# Patient Record
Sex: Male | Born: 1995 | Race: Asian | Hispanic: No | Marital: Single | Smoking: Never smoker
Health system: Southern US, Community
[De-identification: ages and names within clinical notes are randomized; demographics above are authoritative.]

## PROBLEM LIST (undated history)

## (undated) DIAGNOSIS — J301 Allergic rhinitis due to pollen: Secondary | ICD-10-CM

## (undated) DIAGNOSIS — F419 Anxiety disorder, unspecified: Secondary | ICD-10-CM

## (undated) HISTORY — DX: Anxiety disorder, unspecified: F41.9

## (undated) HISTORY — DX: Allergic rhinitis due to pollen: J30.1

---
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
## 2014-03-17 ENCOUNTER — Ambulatory Visit (INDEPENDENT_AMBULATORY_CARE_PROVIDER_SITE_OTHER): Payer: BC Managed Care – PPO | Admitting: Psychology

## 2014-03-17 DIAGNOSIS — F40298 Other specified phobia: Secondary | ICD-10-CM

## 2014-04-07 ENCOUNTER — Ambulatory Visit (INDEPENDENT_AMBULATORY_CARE_PROVIDER_SITE_OTHER): Payer: BC Managed Care – PPO | Admitting: Psychology

## 2014-04-07 DIAGNOSIS — F40298 Other specified phobia: Secondary | ICD-10-CM

## 2014-05-05 ENCOUNTER — Ambulatory Visit: Payer: BC Managed Care – PPO | Admitting: Psychology

## 2014-05-17 ENCOUNTER — Ambulatory Visit (INDEPENDENT_AMBULATORY_CARE_PROVIDER_SITE_OTHER): Payer: BC Managed Care – PPO | Admitting: Psychology

## 2014-05-17 DIAGNOSIS — F40298 Other specified phobia: Secondary | ICD-10-CM

## 2014-05-31 ENCOUNTER — Ambulatory Visit (INDEPENDENT_AMBULATORY_CARE_PROVIDER_SITE_OTHER): Payer: BC Managed Care – PPO | Admitting: Psychology

## 2014-05-31 DIAGNOSIS — F40298 Other specified phobia: Secondary | ICD-10-CM

## 2016-03-24 ENCOUNTER — Ambulatory Visit (INDEPENDENT_AMBULATORY_CARE_PROVIDER_SITE_OTHER): Payer: 59 | Admitting: Psychology

## 2016-03-24 DIAGNOSIS — F408 Other phobic anxiety disorders: Secondary | ICD-10-CM | POA: Diagnosis not present

## 2016-03-26 ENCOUNTER — Ambulatory Visit: Payer: Self-pay | Admitting: Internal Medicine

## 2016-03-31 ENCOUNTER — Encounter: Payer: Self-pay | Admitting: Emergency Medicine

## 2016-03-31 ENCOUNTER — Emergency Department: Payer: 59 | Admitting: Anesthesiology

## 2016-03-31 ENCOUNTER — Encounter
Admission: EM | Disposition: A | Discharge status: Home / Self Care | Payer: Self-pay | Source: Home / Self Care | Attending: Student

## 2016-03-31 ENCOUNTER — Observation Stay
Admission: EM | Admit: 2016-03-31 | Discharge: 2016-04-01 | Disposition: A | Discharge status: Home / Self Care | Payer: 59 | Attending: Surgery | Admitting: Surgery

## 2016-03-31 ENCOUNTER — Emergency Department: Payer: 59

## 2016-03-31 DIAGNOSIS — J301 Allergic rhinitis due to pollen: Secondary | ICD-10-CM | POA: Diagnosis not present

## 2016-03-31 DIAGNOSIS — D3610 Benign neoplasm of peripheral nerves and autonomic nervous system, unspecified: Secondary | ICD-10-CM | POA: Diagnosis not present

## 2016-03-31 DIAGNOSIS — R109 Unspecified abdominal pain: Secondary | ICD-10-CM

## 2016-03-31 DIAGNOSIS — K3580 Unspecified acute appendicitis: Secondary | ICD-10-CM | POA: Diagnosis present

## 2016-03-31 DIAGNOSIS — Z820 Family history of epilepsy and other diseases of the nervous system: Secondary | ICD-10-CM | POA: Diagnosis not present

## 2016-03-31 DIAGNOSIS — K353 Acute appendicitis with localized peritonitis, without perforation or gangrene: Secondary | ICD-10-CM | POA: Diagnosis present

## 2016-03-31 DIAGNOSIS — K381 Appendicular concretions: Secondary | ICD-10-CM | POA: Insufficient documentation

## 2016-03-31 DIAGNOSIS — Z91010 Allergy to peanuts: Secondary | ICD-10-CM | POA: Insufficient documentation

## 2016-03-31 HISTORY — PX: APPENDECTOMY: SHX54

## 2016-03-31 HISTORY — PX: LAPAROSCOPIC APPENDECTOMY: SHX408

## 2016-03-31 LAB — CBC
HEMATOCRIT: 39.7 % — AB (ref 40.0–52.0)
HEMOGLOBIN: 13.3 g/dL (ref 13.0–18.0)
MCH: 28.6 pg (ref 26.0–34.0)
MCHC: 33.6 g/dL (ref 32.0–36.0)
MCV: 85 fL (ref 80.0–100.0)
Platelets: 285 10*3/uL (ref 150–440)
RBC: 4.67 MIL/uL (ref 4.40–5.90)
RDW: 13.4 % (ref 11.5–14.5)
WBC: 13.1 10*3/uL — ABNORMAL HIGH (ref 3.8–10.6)

## 2016-03-31 LAB — COMPREHENSIVE METABOLIC PANEL
ALBUMIN: 4.6 g/dL (ref 3.5–5.0)
ALT: 18 U/L (ref 17–63)
ANION GAP: 8 (ref 5–15)
AST: 24 U/L (ref 15–41)
Alkaline Phosphatase: 74 U/L (ref 38–126)
BUN: 17 mg/dL (ref 6–20)
CHLORIDE: 100 mmol/L — AB (ref 101–111)
CO2: 27 mmol/L (ref 22–32)
Calcium: 9 mg/dL (ref 8.9–10.3)
Creatinine, Ser: 1.1 mg/dL (ref 0.61–1.24)
GFR calc Af Amer: >60 mL/min (ref >60)
GFR calc non Af Amer: >60 mL/min (ref >60)
GLUCOSE: 127 mg/dL — AB (ref 65–99)
POTASSIUM: 3.1 mmol/L — AB (ref 3.5–5.1)
SODIUM: 135 mmol/L (ref 135–145)
Total Bilirubin: 0.6 mg/dL (ref 0.3–1.2)
Total Protein: 7.8 g/dL (ref 6.5–8.1)

## 2016-03-31 LAB — URINALYSIS COMPLETE WITH MICROSCOPIC (ARMC ONLY)
BACTERIA UA: NONE SEEN
Bilirubin Urine: NEGATIVE
GLUCOSE, UA: NEGATIVE mg/dL
Hgb urine dipstick: NEGATIVE
Leukocytes, UA: NEGATIVE
NITRITE: NEGATIVE
Protein, ur: 100 mg/dL — AB
SPECIFIC GRAVITY, URINE: 1.026 (ref 1.005–1.030)
pH: 9 — ABNORMAL HIGH (ref 5.0–8.0)

## 2016-03-31 LAB — LIPASE, BLOOD: LIPASE: 21 U/L (ref 11–51)

## 2016-03-31 SURGERY — APPENDECTOMY, LAPAROSCOPIC
Anesthesia: Choice | Wound class: Clean Contaminated

## 2016-03-31 SURGERY — APPENDECTOMY, LAPAROSCOPIC
Anesthesia: General

## 2016-03-31 MED ORDER — SODIUM CHLORIDE 0.9 % IV BOLUS (SEPSIS)
1000.0000 mL | Freq: Once | INTRAVENOUS | Status: AC
  Administered 2016-03-31: 1000 mL via INTRAVENOUS

## 2016-03-31 MED ORDER — ROCURONIUM BROMIDE 100 MG/10ML IV SOLN
INTRAVENOUS | Status: DC | PRN
  Administered 2016-03-31: 40 mg via INTRAVENOUS

## 2016-03-31 MED ORDER — ONDANSETRON HCL 4 MG/2ML IJ SOLN: 4.0000 mg | Freq: Four times a day (QID) | INTRAMUSCULAR | Status: DC | PRN

## 2016-03-31 MED ORDER — PIPERACILLIN-TAZOBACTAM 3.375 G IVPB
3.3750 g | Freq: Once | INTRAVENOUS | Status: AC
  Administered 2016-03-31: 3.375 g via INTRAVENOUS
  Filled 2016-03-31: qty 50

## 2016-03-31 MED ORDER — LIDOCAINE HCL (CARDIAC) 20 MG/ML IV SOLN
INTRAVENOUS | Status: DC | PRN
  Administered 2016-03-31: 80 mg via INTRAVENOUS

## 2016-03-31 MED ORDER — DIPHENHYDRAMINE HCL 12.5 MG/5ML PO ELIX: 12.5000 mg | ORAL_SOLUTION | Freq: Four times a day (QID) | ORAL | Status: DC | PRN

## 2016-03-31 MED ORDER — PROPOFOL 10 MG/ML IV BOLUS
INTRAVENOUS | Status: DC | PRN
  Administered 2016-03-31: 150 mg via INTRAVENOUS

## 2016-03-31 MED ORDER — ONDANSETRON 8 MG PO TBDP: 4.0000 mg | ORAL_TABLET | Freq: Four times a day (QID) | ORAL | Status: DC | PRN

## 2016-03-31 MED ORDER — DIPHENHYDRAMINE HCL 50 MG/ML IJ SOLN: 12.5000 mg | Freq: Four times a day (QID) | INTRAMUSCULAR | Status: DC | PRN

## 2016-03-31 MED ORDER — PANTOPRAZOLE SODIUM 40 MG IV SOLR
40.0000 mg | Freq: Every day | INTRAVENOUS | Status: DC
  Administered 2016-03-31: 40 mg via INTRAVENOUS
  Filled 2016-03-31: qty 40

## 2016-03-31 MED ORDER — DEXTROSE 5 % IV SOLN
2.0000 g | INTRAVENOUS | Status: AC
  Administered 2016-03-31: 2 g via INTRAVENOUS
  Filled 2016-03-31: qty 2

## 2016-03-31 MED ORDER — DIATRIZOATE MEGLUMINE & SODIUM 66-10 % PO SOLN
15.0000 mL | Freq: Once | ORAL | Status: AC
  Administered 2016-03-31: 15 mL via ORAL

## 2016-03-31 MED ORDER — MORPHINE SULFATE (PF) 2 MG/ML IV SOLN: 2.0000 mg | INTRAVENOUS | Status: DC | PRN

## 2016-03-31 MED ORDER — SUGAMMADEX SODIUM 200 MG/2ML IV SOLN
INTRAVENOUS | Status: DC | PRN
  Administered 2016-03-31: 200 mg via INTRAVENOUS

## 2016-03-31 MED ORDER — ONDANSETRON HCL 4 MG/2ML IJ SOLN
INTRAMUSCULAR | Status: DC | PRN
  Administered 2016-03-31: 4 mg via INTRAVENOUS

## 2016-03-31 MED ORDER — ONDANSETRON HCL 4 MG/2ML IJ SOLN
4.0000 mg | Freq: Once | INTRAMUSCULAR | Status: AC
  Administered 2016-03-31: 4 mg via INTRAVENOUS

## 2016-03-31 MED ORDER — LACTATED RINGERS IV SOLN
INTRAVENOUS | Status: DC
  Administered 2016-03-31: 21:00:00 via INTRAVENOUS

## 2016-03-31 MED ORDER — MIDAZOLAM HCL 2 MG/2ML IJ SOLN
INTRAMUSCULAR | Status: DC | PRN
  Administered 2016-03-31: 2 mg via INTRAVENOUS

## 2016-03-31 MED ORDER — ONDANSETRON HCL 4 MG/2ML IJ SOLN
INTRAMUSCULAR | Status: AC
  Filled 2016-03-31: qty 2

## 2016-03-31 MED ORDER — FENTANYL CITRATE (PF) 100 MCG/2ML IJ SOLN
INTRAMUSCULAR | Status: DC | PRN
  Administered 2016-03-31: 100 ug via INTRAVENOUS

## 2016-03-31 MED ORDER — KETOROLAC TROMETHAMINE 30 MG/ML IJ SOLN
30.0000 mg | Freq: Four times a day (QID) | INTRAMUSCULAR | Status: DC
Start: 2016-03-31 — End: 2016-04-01
  Administered 2016-03-31 – 2016-04-01 (×3): 30 mg via INTRAVENOUS
  Filled 2016-03-31 (×3): qty 1

## 2016-03-31 MED ORDER — DEXAMETHASONE SODIUM PHOSPHATE 10 MG/ML IJ SOLN
INTRAMUSCULAR | Status: DC | PRN
  Administered 2016-03-31: 5 mg via INTRAVENOUS

## 2016-03-31 MED ORDER — OXYCODONE-ACETAMINOPHEN 5-325 MG PO TABS: 1.0000 | ORAL_TABLET | ORAL | Status: DC | PRN

## 2016-03-31 MED ORDER — SODIUM CHLORIDE 0.9 % IV SOLN
3.0000 g | Freq: Four times a day (QID) | INTRAVENOUS | Status: AC
  Administered 2016-03-31 – 2016-04-01 (×3): 3 g via INTRAVENOUS
  Filled 2016-03-31 (×3): qty 3

## 2016-03-31 MED ORDER — IOPAMIDOL (ISOVUE-300) INJECTION 61%
100.0000 mL | Freq: Once | INTRAVENOUS | Status: AC | PRN
  Administered 2016-03-31: 100 mL via INTRAVENOUS

## 2016-03-31 MED ORDER — LACTATED RINGERS IV SOLN
INTRAVENOUS | Status: DC
  Administered 2016-03-31: 17:00:00 via INTRAVENOUS

## 2016-03-31 MED ORDER — BUPIVACAINE-EPINEPHRINE (PF) 0.25% -1:200000 IJ SOLN
INTRAMUSCULAR | Status: AC
  Filled 2016-03-31: qty 30

## 2016-03-31 MED ORDER — ENOXAPARIN SODIUM 40 MG/0.4ML ~~LOC~~ SOLN: 40.0000 mg | SUBCUTANEOUS | Status: DC

## 2016-03-31 MED ORDER — BUPIVACAINE-EPINEPHRINE 0.25% -1:200000 IJ SOLN
INTRAMUSCULAR | Status: DC | PRN
  Administered 2016-03-31: 20 mL

## 2016-03-31 MED ORDER — ONDANSETRON 8 MG PO TBDP: 4.0000 mg | ORAL_TABLET | Freq: Once | ORAL | Status: DC | PRN

## 2016-03-31 MED ORDER — KETOROLAC TROMETHAMINE 30 MG/ML IJ SOLN
INTRAMUSCULAR | Status: DC | PRN
  Administered 2016-03-31: 30 mg via INTRAVENOUS

## 2016-03-31 MED ORDER — MORPHINE SULFATE (PF) 4 MG/ML IV SOLN
4.0000 mg | Freq: Once | INTRAVENOUS | Status: AC
  Administered 2016-03-31: 4 mg via INTRAVENOUS

## 2016-03-31 MED ORDER — MORPHINE SULFATE (PF) 4 MG/ML IV SOLN
INTRAVENOUS | Status: AC
  Filled 2016-03-31: qty 1

## 2016-03-31 MED ORDER — PHENYLEPHRINE HCL 10 MG/ML IJ SOLN
INTRAMUSCULAR | Status: DC | PRN
  Administered 2016-03-31: 200 ug via INTRAVENOUS
  Administered 2016-03-31: 100 ug via INTRAVENOUS

## 2016-03-31 SURGICAL SUPPLY — 35 items
APPLIER CLIP 5 13 M/L LIGAMAX5 (MISCELLANEOUS)
APR CLP MED LRG 5 ANG JAW (MISCELLANEOUS)
BLADE CLIPPER SURG (BLADE) ×2 IMPLANT
BLADE SURG SZ11 CARB STEEL (BLADE) ×2 IMPLANT
CANISTER SUCT 3000ML (MISCELLANEOUS) ×2 IMPLANT
CHLORAPREP W/TINT 26ML (MISCELLANEOUS) ×2 IMPLANT
CLIP APPLIE 5 13 M/L LIGAMAX5 (MISCELLANEOUS) IMPLANT
CUTTER FLEX LINEAR 45M (STAPLE) ×2 IMPLANT
ELECT REM PT RETURN 9FT ADLT (ELECTROSURGICAL) ×2
ELECTRODE REM PT RTRN 9FT ADLT (ELECTROSURGICAL) ×1 IMPLANT
ENDOPOUCH RETRIEVER 10 (MISCELLANEOUS) ×2 IMPLANT
GLOVE BIO SURGEON STRL SZ7 (GLOVE) ×10 IMPLANT
GOWN STRL REUS W/ TWL LRG LVL3 (GOWN DISPOSABLE) ×2 IMPLANT
GOWN STRL REUS W/TWL LRG LVL3 (GOWN DISPOSABLE) ×2
IRRIGATION STRYKERFLOW (MISCELLANEOUS) ×1 IMPLANT
IRRIGATOR STRYKERFLOW (MISCELLANEOUS) ×2
LIQUID BAND (GAUZE/BANDAGES/DRESSINGS) ×2 IMPLANT
MARKER SKIN DUAL TIP RULER LAB (MISCELLANEOUS) IMPLANT
NEEDLE HYPO 25X1 1.5 SAFETY (NEEDLE) ×2 IMPLANT
PACK LAP CHOLECYSTECTOMY (MISCELLANEOUS) ×2 IMPLANT
PENCIL ELECTRO HAND CTR (MISCELLANEOUS) ×2 IMPLANT
RELOAD 45 VASCULAR/THIN (ENDOMECHANICALS) ×2 IMPLANT
RELOAD STAPLE TA45 3.5 REG BLU (ENDOMECHANICALS) ×2 IMPLANT
SCALPEL HARMONIC ACE (MISCELLANEOUS) ×2 IMPLANT
SCISSORS METZENBAUM CVD 33 (INSTRUMENTS) IMPLANT
SHEARS HARMONIC ACE PLUS 36CM (ENDOMECHANICALS) ×2 IMPLANT
SUT MNCRL AB 4-0 PS2 18 (SUTURE) ×2 IMPLANT
SUT VICRYL 0 AB UR-6 (SUTURE) ×4 IMPLANT
SYR 20CC LL (SYRINGE) IMPLANT
TRAY FOLEY W/METER SILVER 16FR (SET/KITS/TRAYS/PACK) IMPLANT
TROCAR BALLN 12MMX100 BLUNT (TROCAR) IMPLANT
TROCAR XCEL BLUNT TIP 100MML (ENDOMECHANICALS) ×2 IMPLANT
TROCAR Z-THREAD OPTICAL 5X100M (TROCAR) ×4 IMPLANT
TUBING CONNECTING 10 (TUBING) ×2 IMPLANT
WATER STERILE IRR 1000ML POUR (IV SOLUTION) ×2 IMPLANT

## 2016-03-31 NOTE — ED Provider Notes (Signed)
[facility] Emergency Department Provider Note  ____________________________________________  Time seen: Approximately 9:05 AM  I have reviewed the triage vital signs and the nursing notes.   HISTORY  Chief Complaint Abdominal Pain    HPI Charles Hayden is a 20 y.o. male with no chronic medical problems who presents for evaluation of less than 24 hours vomiting and lower abdominal pain, worse in the suprapubic region in the right lower quadrant, gradual onset, constant since onset, currently improved after receiving morphine. No fevers, no diarrhea. He has been mildly constipated and has not had a bowel movement in 2 days. No pain or burning with urination. No testicular pain. No chest pain or difficulty breathing.   Past Medical History  Diagnosis Date  . Allergic rhinitis due to pollen     Patient Active Problem List   Diagnosis Date Noted  . Concentration deficit 08/09/2013  . Routine general medical examination at a health care facility 01/04/2013  . Allergic rhinitis due to pollen     History reviewed. No pertinent past surgical history.  No current outpatient prescriptions on file.  Allergies Food and Pollen extract  Family History  Problem Relation Age of Onset  . Alzheimer's disease Maternal Grandfather   . Heart disease Neg Hx   . Diabetes Neg Hx     Social History Social History  Substance Use Topics  . Smoking status: Never Smoker   . Smokeless tobacco: Never Used  . Alcohol Use: No    Review of Systems Constitutional: No fever/chills Eyes: No visual changes. ENT: No sore throat. Cardiovascular: Denies chest pain. Respiratory: Denies shortness of breath. Gastrointestinal: + abdominal pain.  +nausea, + vomiting.  No diarrhea.  No constipation. Genitourinary: Negative for dysuria. Musculoskeletal: Negative for back pain. Skin: Negative for rash. Neurological: Negative for headaches, focal weakness or numbness.  10-point ROS  otherwise negative.  ____________________________________________   PHYSICAL EXAM:  VITAL SIGNS: ED Triage Vitals  Enc Vitals Group     BP 03/31/16 0614 119/77 mmHg     Pulse Rate 03/31/16 0614 109     Resp 03/31/16 0614 18     Temp 03/31/16 0614 98 F (36.7 C)     Temp Source 03/31/16 0614 Oral     SpO2 03/31/16 0614 100 %     Weight 03/31/16 0614 130 lb (58.968 kg)     Height 03/31/16 0614 5\' 10"  (1.778 m)     Head Cir --      Peak Flow --      Pain Score 03/31/16 0614 6     Pain Loc --      Pain Edu? --      Excl. in [place]? --     Constitutional: Alert and oriented. Nontoxic-appearing and in no acute distress. Eyes: Conjunctivae are normal. PERRL. EOMI. Head: Atraumatic. Nose: No congestion/rhinnorhea. Mouth/Throat: Mucous membranes are moist.  Oropharynx non-erythematous. Neck: No stridor.   Cardiovascular: Normal rate, regular rhythm. Grossly normal heart sounds.  Good peripheral circulation. Respiratory: Normal respiratory effort.  No retractions. Lungs CTAB. Gastrointestinal: Soft with tenderness to palpation in the super pubic region as well as the right lower quadrants. Normal Bowel sounds. No CVA tenderness. Genitourinary: deferred Musculoskeletal: No lower extremity tenderness nor edema.  No joint effusions. Neurologic:  Normal speech and language. No gross focal neurologic deficits are appreciated. No gait instability. Skin:  Skin is warm, dry and intact. No rash noted. Psychiatric: Mood and affect are normal. Speech and behavior are normal.  ____________________________________________  LABS (all labs ordered are listed, but only abnormal results are displayed)  Labs Reviewed  COMPREHENSIVE METABOLIC PANEL - Abnormal; Notable for the following:    Potassium 3.1 (*)    Chloride 100 (*)    Glucose, Bld 127 (*)    All other components within normal limits  CBC - Abnormal; Notable for the following:    WBC 13.1 (*)    HCT 39.7 (*)    All other components  within normal limits  URINALYSIS COMPLETEWITH MICROSCOPIC ([facility] ONLY) - Abnormal; Notable for the following:    Color, Urine YELLOW (*)    APPearance CLEAR (*)    Ketones, ur 2+ (*)    pH 9.0 (*)    Protein, ur 100 (*)    Squamous Epithelial / LPF 0-5 (*)    All other components within normal limits  LIPASE, BLOOD   ____________________________________________  EKG  none ____________________________________________  RADIOLOGY  CT abdomen and pelvis IMPRESSION: Enlarged inflamed appendix containing several stones (appendicitis).  Trace amount of free fluid in the pelvis most likely related to appendicitis. No well-defined deep drainable abscess currently detected. No free intraperitoneal air.  The stomach and duodenal are prominent to level where duodenum extends beneath the superior mesenteric artery. This appearance may be related to patient's supine position rather than true SMA syndrome.  These results were called by telephone at the time of interpretation on 03/31/2016 at 11:15 am to Dr. Loura Pardon , who verbally acknowledged these results. ____________________________________________   PROCEDURES  Procedure(s) performed: None  Critical Care performed: No  ____________________________________________   INITIAL IMPRESSION / ASSESSMENT AND PLAN / ED COURSE  Pertinent labs & imaging results that were available during my care of the patient were reviewed by me and considered in my medical decision making (see chart for details).  Charles Hayden is a 20 y.o. male with no chronic medical problems who presents for evaluation of less than 24 hours vomiting and lower abdominal pain, worse in the suprapubic region in the right lower quadrant. On exam he is nontoxic appearing and in no acute distress though he does have tenderness to palpation in the suprapubic region and the right lower quadrant. It is mildly tachycardic on arrival however this has resolved at time of my  assessment. Determined of his vital signs are stable, he is afebrile. Labs reviewed. CMP with mild hypokalemia, potassium 3.1. He also has leukocytosis with a white blood cell count of 13.1. Normal lipase. Urinalysis pending. We'll obtain CT of the abdomen and pelvis to rule out appendicitis.  ----------------------------------------- 11:47 AM on 03/31/2016 ----------------------------------------- CT scan concerning for appendicitis. I discussed the case with Dr. Dahlia Byes of general surgery who will evaluate and admit the patient. ____________________________________________   FINAL CLINICAL IMPRESSION(S) / ED DIAGNOSES  Final diagnoses:  Abdominal pain, unspecified abdominal location  Acute appendicitis, unspecified acute appendicitis type      Joanne Gavel, MD 03/31/16 1227

## 2016-03-31 NOTE — Anesthesia Procedure Notes (Signed)
Procedure Name: Intubation Date/Time: 03/31/2016 5:28 PM Performed by: Aline Brochure Pre-anesthesia Checklist: Patient identified, Emergency Drugs available, Suction available and Patient being monitored Patient Re-evaluated:Patient Re-evaluated prior to inductionOxygen Delivery Method: Circle system utilized Preoxygenation: Pre-oxygenation with 100% oxygen Intubation Type: IV induction and Cricoid Pressure applied Laryngoscope Size: Mac and 4 Grade View: Grade II Tube type: Oral Tube size: 7.0 mm Number of attempts: 1 Airway Equipment and Method: Stylet Placement Confirmation: positive ETCO2 and breath sounds checked- equal and bilateral Secured at: 21 cm Tube secured with: Tape Dental Injury: Teeth and Oropharynx as per pre-operative assessment

## 2016-03-31 NOTE — Anesthesia Preprocedure Evaluation (Signed)
Anesthesia Evaluation  Patient identified by MRN, date of birth, ID band Patient awake    Reviewed: Allergy & Precautions, NPO status , Patient's Chart, lab work & pertinent test results  History of Anesthesia Complications Negative for: history of anesthetic complications  Airway Mallampati: II       Dental  (+) Teeth Intact   Pulmonary neg pulmonary ROS,    Pulmonary exam normal breath sounds clear to auscultation       Cardiovascular negative cardio ROS   Rhythm:Regular     Neuro/Psych negative neurological ROS     GI/Hepatic negative GI ROS, Neg liver ROS,   Endo/Other  negative endocrine ROS  Renal/GU negative Renal ROS     Musculoskeletal negative musculoskeletal ROS (+)   Abdominal Normal abdominal exam  (+)   Peds negative pediatric ROS (+)  Hematology negative hematology ROS (+)   Anesthesia Other Findings   Reproductive/Obstetrics                             Anesthesia Physical Anesthesia Plan  ASA: I  Anesthesia Plan: General   Post-op Pain Management:    Induction: Intravenous  Airway Management Planned: Oral ETT  Additional Equipment:   Intra-op Plan:   Post-operative Plan: Extubation in OR  Informed Consent: I have reviewed the patients History and Physical, chart, labs and discussed the procedure including the risks, benefits and alternatives for the proposed anesthesia with the patient or authorized representative who has indicated his/her understanding and acceptance.     Plan Discussed with: CRNA  Anesthesia Plan Comments:         Anesthesia Quick Evaluation

## 2016-03-31 NOTE — Op Note (Signed)
laparascopic appendectomy   Merlyn Lot Date of operation:  03/31/2016  Indications: The patient presented with a history of  abdominal pain. Workup has revealed findings consistent with acute appendicitis.  Pre-operative Diagnosis: Acute appendicitis without mention of peritonitis  Post-operative Diagnosis: Same  Surgeon: Caroleen Hamman, MD, FACS  Anesthesia: General with endotracheal tube  Findings: Acute non perforated appendicitis  Estimated Blood Loss: 5cc         Specimens: appendix         Complications:  none  Procedure Details  The patient was seen again in the preop area. The options of surgery versus observation were reviewed with the patient and/or family. The risks of bleeding, infection, recurrence of symptoms, negative laparoscopy, potential for an open procedure, bowel injury, abscess or infection, were all reviewed as well. The patient was taken to Operating Room, identified as Charles Hayden and the procedure verified as laparoscopic appendectomy. A Time Out was held and the above information confirmed.  The patient was placed in the supine position and general anesthesia was induced.  Antibiotic prophylaxis was administered and VT E prophylaxis was in place. A Foley catheter was placed by the nursing staff.   The abdomen was prepped and draped in a sterile fashion. An infraumbilical incision was made. A cutdown technique was used to enter the abdominal cavity. Two vicryl stitches were placed on the fascia and a Hasson trocar inserted. Pneumoperitoneum obtained. Two 5 mm ports were placed under direct visualization.   The appendix was identified and found to be acutely inflamed , it was carefully dissected. The base of the appendix was dissected out and divided with a vascular load Endo GIA. The mesoappendix was divided withHarmonic scalpel. The appendix was passed out through the left lateral port site with the aid of an Endo Catch bag. The right lower quadrant and pelvis was  then irrigated with normal saline which was aspirated. Inspection  failed to identify any additional bleeding and there were no signs of bowel injury. Fascia was closed with 0 Vicryl interrupted sutures  under direct vision.   Again the right lower quadrant was inspected there was no sign of bleeding or bowel injury therefore pneumoperitoneum was released, all ports were removed and the skin incisions were approximated with subcuticular 4-0 Monocryl. Dermabond was placed.  The patient tolerated the procedure well, there were no complications. The sponge lap and needle count were correct at the end of the procedure.  The patient was taken to the recovery room in stable condition to be admitted for continued care.    Caroleen Hamman, MD FACS

## 2016-03-31 NOTE — Anesthesia Postprocedure Evaluation (Signed)
Anesthesia Post Note  Patient: Charles Hayden  Procedure(s) Performed: Procedure(s) (LRB): APPENDECTOMY LAPAROSCOPIC (N/A)  Patient location during evaluation: PACU Anesthesia Type: General Level of consciousness: awake Pain management: pain level controlled Vital Signs Assessment: post-procedure vital signs reviewed and stable Respiratory status: spontaneous breathing Cardiovascular status: blood pressure returned to baseline Anesthetic complications: no    Last Vitals:  Filed Vitals:   03/31/16 1958 03/31/16 2037  BP: 100/64 100/59  Pulse: 88 89  Temp: 36.9 C 37.1 C  Resp: 20 20    Last Pain:  Filed Vitals:   03/31/16 2155  PainSc: Asleep                 VAN STAVEREN,Yarrow Linhart

## 2016-03-31 NOTE — Transfer of Care (Signed)
Immediate Anesthesia Transfer of Care Note  Patient: Charles Hayden  Procedure(s) Performed: Procedure(s): APPENDECTOMY LAPAROSCOPIC (N/A)  Patient Location: PACU  Anesthesia Type:General  Level of Consciousness: sedated  Airway & Oxygen Therapy: Patient Spontanous Breathing and Patient connected to face mask oxygen  Post-op Assessment: Report given to RN and Post -op Vital signs reviewed and stable  Post vital signs: stable  Last Vitals:  Filed Vitals:   03/31/16 1440 03/31/16 1836  BP: 123/77 97/58  Pulse:  100  Temp:  36.7 C  Resp: 16 17    Complications: No apparent anesthesia complications

## 2016-03-31 NOTE — H&P (Signed)
Patient ID: Charles Hayden, male   DOB: Jul 11, 1996, 20 y.o.   MRN: [id number]  History of Present Illness Charles Hayden is a 20 y.o. male with 24 hours of abdominal pain patient reports that the pain initially was periumbilical and now localized to the right lower quadrant. Pain is severe and constant, and dull in nature. Pain is worsening with movement and gets better when the patient lays still. He did have multiple episodes of emesis and has decreased appetite and some nausea. No fevers no chills. No previous abdominal operations  Past Medical History Past Medical History  Diagnosis Date  . Allergic rhinitis due to pollen       History reviewed. No pertinent past surgical history.  Allergies  Allergen Reactions  . Food     Tested via allergist, allergic peanuts...given Epi pen 04/23/11  . Pollen Extract     Intradermal testing, 04/23/11    Current Facility-Administered Medications  Medication Dose Route Frequency Provider Last Rate Last Dose  . ondansetron (ZOFRAN-ODT) disintegrating tablet 4 mg  4 mg Oral Once PRN Hinda Kehr, MD      . piperacillin-tazobactam (ZOSYN) IVPB 3.375 g  3.375 g Intravenous Once Joanne Gavel, MD 12.5 mL/hr at 03/31/16 1217 3.375 g at 03/31/16 1217   No current outpatient prescriptions on file.    Family History Family History  Problem Relation Age of Onset  . Alzheimer's disease Maternal Grandfather   . Heart disease Neg Hx   . Diabetes Neg Hx      Social History Social History  Substance Use Topics  . Smoking status: Never Smoker   . Smokeless tobacco: Never Used  . Alcohol Use: No      ROS  10 pts review of system was performance otherwise negative other than what is stated in the history of present illness   Physical Exam Blood pressure 122/90, pulse 91, temperature 98 F (36.7 C), temperature source Oral, resp. rate 18, height 5\' 10"  (1.778 m), weight 58.968 kg (130 lb), SpO2 98 %.  CONSTITUTIONAL: NAD, laying still EYES: Pupils equal,  round, and reactive to light, Sclera non-icteric. EARS, NOSE, MOUTH AND THROAT: The oropharynx is clear. Oral mucosa is pink and moist. Hearing is intact to voice.  NECK: Trachea is midline, and there is no jugular venous distension. Thyroid is without palpable abnormalities. LYMPH NODES:  Lymph nodes in the neck are not enlarged. RESPIRATORY:  Lungs are clear, and breath sounds are equal bilaterally. Normal respiratory effort without pathologic use of accessory muscles. CARDIOVASCULAR: Heart is regular without murmurs, gallops, or rubs. GI: The abdomen is  soft, tender to palpation in the right lower quadrant. + rebound GU: deferred MUSCULOSKELETAL:  Normal muscle strength and tone in all four extremities.    SKIN: Skin turgor is normal. There are no pathologic skin lesions.  NEUROLOGIC:  Motor and sensation is grossly normal.  Cranial nerves are grossly intact. PSYCH:  Alert and oriented to person, place and time. Affect is normal.  Data Reviewed CT I have personally reviewed the patient's imaging and medical records.    Assessment/ Plan 20 year old male with classic signs and symptoms of acute appendicitis CT scan reviewed there is evidence of an appendicolith. Discussed with the patient in detail about the recommendation for laparoscopic appendectomy. Given the degree of dilation of the appendix and having the appendicolith I will would not recommend medical management. Discussed with the patient in detail about the operation, risk, benefits and possible complications including but not limited to:  Bleeding, infection, stump blowout, re-intervention and prolonged hospitalization. He understands and wishes to proceed . We'll start broad-spectrum antibiotics and add him to the schedule today for laparoscopic appendectomy. All questions were answered Caroleen Hamman, MD Amber 03/31/2016, 12:27 PM

## 2016-03-31 NOTE — ED Notes (Signed)
Pt states lower mid abd pain that began at 2300 yesterday. Pt states has been vomiting. Pt states pain is independent of movement. Pt states unsure of last bowel movement and denies known fever.

## 2016-04-01 ENCOUNTER — Encounter: Payer: Self-pay | Admitting: Surgery

## 2016-04-01 MED ORDER — OXYCODONE-ACETAMINOPHEN 5-325 MG PO TABS: 1.0000 | ORAL_TABLET | ORAL | Status: DC | PRN

## 2016-04-01 NOTE — Progress Notes (Signed)
IV was removed. Discharge instructions, follow-up appointments, and prescriptions were provided to the pt and mother at bedside. All questions were answered. The pt was taken downstairs via wheelchair by volunteer services.

## 2016-04-01 NOTE — Discharge Summary (Signed)
  Patient ID: Charles Hayden MRN: [id number] DOB/AGE: 1996/07/07 20 y.o.  Admit date: 03/31/2016 Discharge date: 04/01/2016   Discharge Diagnoses:  Active Problems:   Acute appendicitis with localized peritonitis   Procedures:Lap appy  Hospital Course: 20 yo male admitted w classic signs and sxs of appendicitis confirmed by CT. Underwent an uneventful lap appy.At the time of DC he was ambulating, Tolerating PO his incisions were c/d/i.   Disposition: Home  Discharge Instructions    Call MD for:  difficulty breathing, headache or visual disturbances    Complete by:  As directed      Call MD for:  hives    Complete by:  As directed      Call MD for:  persistant dizziness or light-headedness    Complete by:  As directed      Call MD for:  persistant nausea and vomiting    Complete by:  As directed      Call MD for:  redness, tenderness, or signs of infection (pain, swelling, redness, odor or green/yellow discharge around incision site)    Complete by:  As directed      Call MD for:  severe uncontrolled pain    Complete by:  As directed      Call MD for:  temperature >100.4    Complete by:  As directed      Diet - low sodium heart healthy    Complete by:  As directed      Discharge instructions    Complete by:  As directed   May shower starting  Tomorrow am     Increase activity slowly    Complete by:  As directed      Lifting restrictions    Complete by:  As directed   20 lbs     No dressing needed    Complete by:  As directed             Medication List    TAKE these medications        oxyCODONE-acetaminophen 5-325 MG tablet  Commonly known as:  PERCOCET/ROXICET  Take 1-2 tablets by mouth every 4 (four) hours as needed for moderate pain.           Follow-up Information    Follow up with [facility] In 2 weeks.   Contact information:   [place]       Caroleen Hamman, MD  FACS

## 2016-04-01 NOTE — Discharge Instructions (Signed)
Laparoscopic Appendectomy  °Care After  ° ° °These instructions give you information on caring for yourself after your procedure. Your doctor may also give you more specific instructions. Call your doctor if you have any problems or questions after your procedure.  °HOME CARE  °Do not drive while taking pain medicine (narcotics).  °Take medicine (stool softener) if you cannot poop (constipated).  °Change your bandages (dressings) as told by your doctor.  °Keep your wounds clean and dry. Wash the wounds gently with soap and water. Gently pat the wounds dry with a clean towel.  °Do not take baths, swim, or use hot tubs for 10 days, or as told by your doctor.  °Only take medicine as told by your doctor.  °Continue your normal diet as told by your doctor.  °Do not lift more than 10 pounds (4.5 kilograms) or play contact sports for 3 weeks, or as told by your doctor.  °Slowly increase your activity.  °Take deep breaths to avoid a lung infection (pneumonia). °GET HELP RIGHT AWAY IF:  °You have a fever >101 °You have a rash.  °You have trouble breathing or sharp chest pain.  °You have a reaction to the medicine you are taking.  °Your wound is red, puffy (swollen), or painful.  °You have yellowish-white fluid (pus) coming from the wound.  °You have fluid coming from the wound for longer than 1 day.  °You notice a bad smell coming from the wound or bandage.  °Your wound breaks open after stitches (sutures) or staples are removed.  °You have pain in the shoulders or shoulder blades.   °You are short of breath.  °You feel sick to your stomach (nauseous) or throw up (vomit).  °MAKE SURE YOU:  °Understand these instructions.  °Will watch your condition.  °Will get help right away if you are not doing well or get worse. ° °

## 2016-04-02 LAB — SURGICAL PATHOLOGY

## 2016-04-04 ENCOUNTER — Ambulatory Visit: Payer: 59 | Admitting: Psychology

## 2016-04-07 ENCOUNTER — Ambulatory Visit (INDEPENDENT_AMBULATORY_CARE_PROVIDER_SITE_OTHER): Payer: 59 | Admitting: Psychology

## 2016-04-07 DIAGNOSIS — F408 Other phobic anxiety disorders: Secondary | ICD-10-CM | POA: Diagnosis not present

## 2016-04-15 ENCOUNTER — Ambulatory Visit (INDEPENDENT_AMBULATORY_CARE_PROVIDER_SITE_OTHER): Payer: 59 | Admitting: Internal Medicine

## 2016-04-15 ENCOUNTER — Encounter: Payer: Self-pay | Admitting: Internal Medicine

## 2016-04-15 VITALS — BP 100/76 | HR 98 | Temp 98.5°F | Ht 66.75 in | Wt 125.0 lb

## 2016-04-15 DIAGNOSIS — F39 Unspecified mood [affective] disorder: Secondary | ICD-10-CM

## 2016-04-15 DIAGNOSIS — Z0000 Encounter for general adult medical examination without abnormal findings: Secondary | ICD-10-CM

## 2016-04-15 NOTE — Assessment & Plan Note (Signed)
Not MDD or anything that sounds like meds needed at this point Just getting started with Dr Cheryln Manly

## 2016-04-15 NOTE — Progress Notes (Signed)
Pre visit review using our clinic review tool, if applicable. No additional management support is needed unless otherwise documented below in the visit note. 

## 2016-04-15 NOTE — Progress Notes (Signed)
Subjective:    Patient ID: Charles Hayden, male    DOB: 1996-11-27, 20 y.o.   MRN: [id number]  HPI Here for physical  Does attend [employer] Still some problems with time management--did see counselor once on campus for this Grades are falling some Mood is "neutral" Lives in apartment on campus with 3 others (suite) Still a loner--states he is satisfied like this  Recovered from appendectomy fine Appetite is good Bowels are fine  Seeing psychologist, Dr Cheryln Manly,  in [place] for mood problems No meds as yet Just started  No drugs or alcohol No relationship No sexuality concerns  No current outpatient prescriptions on file prior to visit.   No current facility-administered medications on file prior to visit.    Allergies  Allergen Reactions  . Food     Tested via allergist, allergic peanuts...given Epi pen 04/23/11  . Pollen Extract     Intradermal testing, 04/23/11    Past Medical History  Diagnosis Date  . Allergic rhinitis due to pollen     Past Surgical History  Procedure Laterality Date  . Laparoscopic appendectomy N/A 03/31/2016    Procedure: APPENDECTOMY LAPAROSCOPIC;  Surgeon: Jules Husbands, MD;  Location: [facility] ORS;  Service: General;  Laterality: N/A;    Family History  Problem Relation Age of Onset  . Alzheimer's disease Maternal Grandfather   . Heart disease Neg Hx   . Diabetes Neg Hx     Social History   Social History  . Marital Status: Single    Spouse Name: N/A  . Number of Children: N/A  . Years of Education: N/A   Occupational History  . Not on file.   Social History Main Topics  . Smoking status: Never Smoker   . Smokeless tobacco: Never Used  . Alcohol Use: No  . Drug Use: No  . Sexual Activity: Not on file   Other Topics Concern  . Not on file   Social History Narrative   Attends [employer]   Considering changing to computer science   Review of Systems  Constitutional:       No exercise  Weight  stable Sleep issues---not enough   HENT: Negative for dental problem, hearing loss and tinnitus.        Keeps up with dentist  Eyes: Negative for visual disturbance.       No diplopia or unilateral vision loss  Respiratory: Negative for cough, chest tightness and shortness of breath.        Noisy breathing at times  Cardiovascular: Positive for palpitations. Negative for chest pain and leg swelling.       Occ "heavy" feeling---heart slow (if gets up suddenly)  Gastrointestinal: Negative for nausea, vomiting, abdominal pain, constipation and blood in stool.       No heartburn  Endocrine: Negative for polydipsia and polyuria.  Genitourinary: Negative for urgency and difficulty urinating.  Musculoskeletal: Negative for back pain, joint swelling and arthralgias.  Skin: Negative for rash.       No suspicious lesions  Allergic/Immunologic: Positive for environmental allergies. Negative for immunocompromised state.       Uses OTC allegra or zyrtec  Neurological: Negative for dizziness, syncope, weakness, light-headedness and headaches.  Hematological: Negative for adenopathy. Does not bruise/bleed easily.  Psychiatric/Behavioral:       Sleep and mood issues       Objective:   Physical Exam  Constitutional: He is oriented to person, place, and time. He appears well-developed and  well-nourished. No distress.  HENT:  Head: Normocephalic and atraumatic.  Right Ear: External ear normal.  Left Ear: External ear normal.  Mouth/Throat: Oropharynx is clear and moist. No oropharyngeal exudate.  Eyes: Conjunctivae are normal. Pupils are equal, round, and reactive to light.  Neck: Normal range of motion. Neck supple. No thyromegaly present.  Cardiovascular: Normal rate, regular rhythm, normal heart sounds and intact distal pulses.  Exam reveals no gallop.   No murmur heard. Pulmonary/Chest: Effort normal and breath sounds normal. No respiratory distress. He has no wheezes. He has no rales.    Abdominal: Soft. There is no tenderness.  Genitourinary:  Normal testes  Musculoskeletal: He exhibits no edema or tenderness.  Lymphadenopathy:    He has no cervical adenopathy.  Neurological: He is alert and oriented to person, place, and time.  Skin: No rash noted. No erythema.  Psychiatric:  Mood seems neutral Low volume speech  Normal appearance          Assessment & Plan:

## 2016-04-15 NOTE — Assessment & Plan Note (Signed)
Discussed safety issues, safe sex, etc No preventative needs now Yearly flu

## 2016-04-17 ENCOUNTER — Encounter: Payer: Self-pay | Admitting: General Surgery

## 2016-04-17 ENCOUNTER — Ambulatory Visit (INDEPENDENT_AMBULATORY_CARE_PROVIDER_SITE_OTHER): Payer: 59 | Admitting: General Surgery

## 2016-04-17 VITALS — BP 103/70 | HR 106 | Temp 98.2°F | Ht 66.0 in | Wt 125.0 lb

## 2016-04-17 DIAGNOSIS — Z4889 Encounter for other specified surgical aftercare: Secondary | ICD-10-CM

## 2016-04-17 NOTE — Patient Instructions (Signed)

## 2016-04-17 NOTE — Progress Notes (Signed)
Outpatient Surgical Follow Up  04/17/2016  Charles Hayden is an 20 y.o. male.   Chief Complaint  Patient presents with  . Routine Post Op    Laparoscopic Appendectomy 03/31/2016 Dr. Dahlia Byes    HPI: 20 year old male returns to clinic 2 weeks status post laparoscopic appendectomy. Patient reports doing well. He denies any fevers, chills, nausea, vomiting, diarrhea, constipation, abdominal pain. He's been very happy with his surgical experience.  Past Medical History  Diagnosis Date  . Allergic rhinitis due to pollen     Past Surgical History  Procedure Laterality Date  . Laparoscopic appendectomy N/A 03/31/2016    Procedure: APPENDECTOMY LAPAROSCOPIC;  Surgeon: Charles Husbands, MD;  Location: [facility] ORS;  Service: General;  Laterality: N/A;  . Appendectomy  03/31/2016    Dr. Dahlia Byes    Family History  Problem Relation Age of Onset  . Alzheimer's disease Maternal Grandfather   . Heart disease Neg Hx   . Diabetes Neg Hx     Social History:  reports that he has never smoked. He has never used smokeless tobacco. He reports that he does not drink alcohol or use illicit drugs.  Allergies:  Allergies  Allergen Reactions  . Food     Tested via allergist, allergic peanuts...given Epi pen 04/23/11  . Pollen Extract     Intradermal testing, 04/23/11    Medications reviewed.    ROS  A multipoint review of systems was completed, all pertinent positives and negatives are documented within the history of present illness and remainder are negative.   BP 103/70 mmHg  Pulse 106  Temp(Src) 98.2 F (36.8 C) (Oral)  Ht 5\' 6"  (1.676 m)  Wt 56.7 kg (125 lb)  BMI 20.19 kg/m2  Physical Exam  Gen.: No acute distress Chest: Clear to auscultation Heart: Tachycardic Abdomen: Soft, nontender, nondistended. Well approximated laparoscopic incision sites without evidence of erythema or drainage.   No results found for this or any previous visit (from the past 48 hour(s)). No results  found.  Assessment/Plan:  1. Aftercare following surgery 20 year old male status post lap scopic appendectomy. Pathology reviewed with the patient. Discussed anticipated postoperative course to include wound healing and return to activities. Patient voiced understanding. He'll follow-up in clinic on an as-needed basis.     [name], MD FACS General Surgeon  04/17/2016,10:53 AM

## 2016-04-18 ENCOUNTER — Ambulatory Visit (INDEPENDENT_AMBULATORY_CARE_PROVIDER_SITE_OTHER): Payer: 59 | Admitting: Psychology

## 2016-04-18 DIAGNOSIS — F408 Other phobic anxiety disorders: Secondary | ICD-10-CM

## 2016-05-02 ENCOUNTER — Ambulatory Visit (INDEPENDENT_AMBULATORY_CARE_PROVIDER_SITE_OTHER): Payer: 59 | Admitting: Psychology

## 2016-05-02 DIAGNOSIS — F408 Other phobic anxiety disorders: Secondary | ICD-10-CM | POA: Diagnosis not present

## 2016-05-16 ENCOUNTER — Ambulatory Visit: Payer: 59 | Admitting: Psychology

## 2016-05-30 ENCOUNTER — Ambulatory Visit (INDEPENDENT_AMBULATORY_CARE_PROVIDER_SITE_OTHER): Payer: 59 | Admitting: Psychology

## 2016-05-30 DIAGNOSIS — F408 Other phobic anxiety disorders: Secondary | ICD-10-CM | POA: Diagnosis not present

## 2016-06-06 ENCOUNTER — Ambulatory Visit: Payer: Self-pay | Admitting: Psychology

## 2016-06-13 ENCOUNTER — Ambulatory Visit (INDEPENDENT_AMBULATORY_CARE_PROVIDER_SITE_OTHER): Payer: 59 | Admitting: Psychology

## 2016-06-13 DIAGNOSIS — F408 Other phobic anxiety disorders: Secondary | ICD-10-CM

## 2016-06-20 ENCOUNTER — Ambulatory Visit (INDEPENDENT_AMBULATORY_CARE_PROVIDER_SITE_OTHER): Payer: 59 | Admitting: Psychology

## 2016-06-20 DIAGNOSIS — F408 Other phobic anxiety disorders: Secondary | ICD-10-CM | POA: Diagnosis not present

## 2016-06-27 ENCOUNTER — Ambulatory Visit (INDEPENDENT_AMBULATORY_CARE_PROVIDER_SITE_OTHER): Payer: 59 | Admitting: Psychology

## 2016-06-27 DIAGNOSIS — F408 Other phobic anxiety disorders: Secondary | ICD-10-CM

## 2016-07-11 ENCOUNTER — Ambulatory Visit (INDEPENDENT_AMBULATORY_CARE_PROVIDER_SITE_OTHER): Payer: 59 | Admitting: Psychology

## 2016-07-11 DIAGNOSIS — F408 Other phobic anxiety disorders: Secondary | ICD-10-CM | POA: Diagnosis not present

## 2016-07-18 ENCOUNTER — Ambulatory Visit (INDEPENDENT_AMBULATORY_CARE_PROVIDER_SITE_OTHER): Payer: 59 | Admitting: Psychology

## 2016-07-18 DIAGNOSIS — F408 Other phobic anxiety disorders: Secondary | ICD-10-CM

## 2016-07-25 ENCOUNTER — Ambulatory Visit (INDEPENDENT_AMBULATORY_CARE_PROVIDER_SITE_OTHER): Payer: 59 | Admitting: Psychology

## 2016-07-25 DIAGNOSIS — F408 Other phobic anxiety disorders: Secondary | ICD-10-CM | POA: Diagnosis not present

## 2016-08-01 ENCOUNTER — Ambulatory Visit: Payer: 59 | Admitting: Psychology

## 2016-08-06 ENCOUNTER — Encounter: Payer: Self-pay | Admitting: Internal Medicine

## 2016-08-07 NOTE — Telephone Encounter (Signed)
Please get a copy of the form and fill in--and bring to me to sign. Let him know about it. If you can't get the form, have him drop a copy off

## 2016-08-08 ENCOUNTER — Telehealth: Payer: Self-pay

## 2016-08-08 ENCOUNTER — Ambulatory Visit (INDEPENDENT_AMBULATORY_CARE_PROVIDER_SITE_OTHER): Payer: 59 | Admitting: Psychology

## 2016-08-08 DIAGNOSIS — F4323 Adjustment disorder with mixed anxiety and depressed mood: Secondary | ICD-10-CM | POA: Diagnosis not present

## 2016-08-11 ENCOUNTER — Telehealth: Payer: Self-pay

## 2016-08-11 NOTE — Telephone Encounter (Signed)
MyChart Message sent to pt.

## 2016-08-15 ENCOUNTER — Ambulatory Visit: Payer: 59 | Admitting: Psychology

## 2016-08-26 ENCOUNTER — Ambulatory Visit (INDEPENDENT_AMBULATORY_CARE_PROVIDER_SITE_OTHER): Payer: 59 | Admitting: Psychology

## 2016-08-26 DIAGNOSIS — F4323 Adjustment disorder with mixed anxiety and depressed mood: Secondary | ICD-10-CM | POA: Diagnosis not present

## 2016-09-09 ENCOUNTER — Ambulatory Visit (INDEPENDENT_AMBULATORY_CARE_PROVIDER_SITE_OTHER): Payer: 59 | Admitting: Psychology

## 2016-09-09 DIAGNOSIS — F4323 Adjustment disorder with mixed anxiety and depressed mood: Secondary | ICD-10-CM

## 2016-09-16 ENCOUNTER — Ambulatory Visit (INDEPENDENT_AMBULATORY_CARE_PROVIDER_SITE_OTHER): Payer: 59 | Admitting: Psychology

## 2016-09-16 DIAGNOSIS — F408 Other phobic anxiety disorders: Secondary | ICD-10-CM | POA: Diagnosis not present

## 2016-09-23 ENCOUNTER — Ambulatory Visit (INDEPENDENT_AMBULATORY_CARE_PROVIDER_SITE_OTHER): Payer: 59 | Admitting: Psychology

## 2016-09-23 DIAGNOSIS — F408 Other phobic anxiety disorders: Secondary | ICD-10-CM

## 2016-09-24 NOTE — Telephone Encounter (Signed)
Opened in error

## 2016-10-07 ENCOUNTER — Ambulatory Visit (INDEPENDENT_AMBULATORY_CARE_PROVIDER_SITE_OTHER): Payer: 59 | Admitting: Psychology

## 2016-10-07 DIAGNOSIS — F408 Other phobic anxiety disorders: Secondary | ICD-10-CM | POA: Diagnosis not present

## 2016-10-21 ENCOUNTER — Ambulatory Visit (INDEPENDENT_AMBULATORY_CARE_PROVIDER_SITE_OTHER): Payer: 59 | Admitting: Psychology

## 2016-10-21 DIAGNOSIS — F408 Other phobic anxiety disorders: Secondary | ICD-10-CM

## 2016-11-04 ENCOUNTER — Ambulatory Visit (INDEPENDENT_AMBULATORY_CARE_PROVIDER_SITE_OTHER): Payer: 59 | Admitting: Psychology

## 2016-11-04 DIAGNOSIS — F408 Other phobic anxiety disorders: Secondary | ICD-10-CM

## 2016-11-11 ENCOUNTER — Ambulatory Visit (INDEPENDENT_AMBULATORY_CARE_PROVIDER_SITE_OTHER): Payer: 59 | Admitting: Psychology

## 2016-11-11 DIAGNOSIS — F408 Other phobic anxiety disorders: Secondary | ICD-10-CM | POA: Diagnosis not present

## 2016-11-18 ENCOUNTER — Ambulatory Visit (INDEPENDENT_AMBULATORY_CARE_PROVIDER_SITE_OTHER): Payer: 59 | Admitting: Psychology

## 2016-11-18 DIAGNOSIS — F408 Other phobic anxiety disorders: Secondary | ICD-10-CM | POA: Diagnosis not present

## 2016-12-02 ENCOUNTER — Ambulatory Visit (INDEPENDENT_AMBULATORY_CARE_PROVIDER_SITE_OTHER): Payer: 59 | Admitting: Psychology

## 2016-12-02 DIAGNOSIS — F408 Other phobic anxiety disorders: Secondary | ICD-10-CM | POA: Diagnosis not present

## 2016-12-16 ENCOUNTER — Ambulatory Visit (INDEPENDENT_AMBULATORY_CARE_PROVIDER_SITE_OTHER): Payer: 59 | Admitting: Psychology

## 2016-12-16 DIAGNOSIS — F408 Other phobic anxiety disorders: Secondary | ICD-10-CM

## 2016-12-18 ENCOUNTER — Ambulatory Visit (INDEPENDENT_AMBULATORY_CARE_PROVIDER_SITE_OTHER): Payer: 59 | Admitting: Psychology

## 2016-12-18 DIAGNOSIS — F4010 Social phobia, unspecified: Secondary | ICD-10-CM

## 2016-12-30 ENCOUNTER — Ambulatory Visit (INDEPENDENT_AMBULATORY_CARE_PROVIDER_SITE_OTHER): Payer: 59 | Admitting: Psychology

## 2016-12-30 DIAGNOSIS — F408 Other phobic anxiety disorders: Secondary | ICD-10-CM | POA: Diagnosis not present

## 2017-01-06 ENCOUNTER — Ambulatory Visit: Payer: 59 | Admitting: Psychology

## 2017-01-13 ENCOUNTER — Ambulatory Visit: Payer: 59 | Admitting: Psychology

## 2017-01-14 ENCOUNTER — Ambulatory Visit: Payer: 59 | Admitting: Psychology

## 2017-01-17 ENCOUNTER — Ambulatory Visit (INDEPENDENT_AMBULATORY_CARE_PROVIDER_SITE_OTHER): Payer: 59 | Admitting: Psychology

## 2017-01-17 DIAGNOSIS — F411 Generalized anxiety disorder: Secondary | ICD-10-CM

## 2017-01-17 DIAGNOSIS — F341 Dysthymic disorder: Secondary | ICD-10-CM | POA: Diagnosis not present

## 2017-01-19 ENCOUNTER — Ambulatory Visit: Payer: 59 | Admitting: Psychology

## 2017-01-28 ENCOUNTER — Ambulatory Visit (INDEPENDENT_AMBULATORY_CARE_PROVIDER_SITE_OTHER): Payer: 59 | Admitting: Psychology

## 2017-01-28 DIAGNOSIS — F408 Other phobic anxiety disorders: Secondary | ICD-10-CM

## 2017-02-04 ENCOUNTER — Ambulatory Visit (INDEPENDENT_AMBULATORY_CARE_PROVIDER_SITE_OTHER): Payer: 59 | Admitting: Psychology

## 2017-02-04 DIAGNOSIS — F408 Other phobic anxiety disorders: Secondary | ICD-10-CM

## 2017-02-04 DIAGNOSIS — F341 Dysthymic disorder: Secondary | ICD-10-CM | POA: Diagnosis not present

## 2017-02-04 DIAGNOSIS — F840 Autistic disorder: Secondary | ICD-10-CM | POA: Diagnosis not present

## 2017-02-11 ENCOUNTER — Ambulatory Visit (INDEPENDENT_AMBULATORY_CARE_PROVIDER_SITE_OTHER): Payer: 59 | Admitting: Psychology

## 2017-02-11 DIAGNOSIS — F408 Other phobic anxiety disorders: Secondary | ICD-10-CM

## 2017-02-20 ENCOUNTER — Ambulatory Visit (INDEPENDENT_AMBULATORY_CARE_PROVIDER_SITE_OTHER): Payer: 59 | Admitting: Psychology

## 2017-02-20 DIAGNOSIS — F408 Other phobic anxiety disorders: Secondary | ICD-10-CM | POA: Diagnosis not present

## 2017-03-06 ENCOUNTER — Ambulatory Visit (INDEPENDENT_AMBULATORY_CARE_PROVIDER_SITE_OTHER): Payer: 59 | Admitting: Psychology

## 2017-03-06 DIAGNOSIS — F840 Autistic disorder: Secondary | ICD-10-CM | POA: Diagnosis not present

## 2017-03-06 DIAGNOSIS — F341 Dysthymic disorder: Secondary | ICD-10-CM

## 2017-03-16 ENCOUNTER — Ambulatory Visit (INDEPENDENT_AMBULATORY_CARE_PROVIDER_SITE_OTHER): Payer: 59 | Admitting: Psychology

## 2017-03-16 DIAGNOSIS — F408 Other phobic anxiety disorders: Secondary | ICD-10-CM

## 2017-03-27 ENCOUNTER — Ambulatory Visit: Payer: Self-pay | Admitting: Psychology

## 2017-03-30 ENCOUNTER — Ambulatory Visit: Payer: 59 | Admitting: Psychology

## 2017-04-07 ENCOUNTER — Ambulatory Visit (INDEPENDENT_AMBULATORY_CARE_PROVIDER_SITE_OTHER): Payer: 59 | Admitting: Psychology

## 2017-04-07 DIAGNOSIS — F408 Other phobic anxiety disorders: Secondary | ICD-10-CM

## 2017-04-17 ENCOUNTER — Ambulatory Visit (INDEPENDENT_AMBULATORY_CARE_PROVIDER_SITE_OTHER): Payer: 59 | Admitting: Psychology

## 2017-04-17 DIAGNOSIS — F408 Other phobic anxiety disorders: Secondary | ICD-10-CM

## 2017-05-15 ENCOUNTER — Ambulatory Visit (INDEPENDENT_AMBULATORY_CARE_PROVIDER_SITE_OTHER): Payer: 59 | Admitting: Psychology

## 2017-05-15 DIAGNOSIS — F408 Other phobic anxiety disorders: Secondary | ICD-10-CM

## 2017-05-29 ENCOUNTER — Ambulatory Visit (INDEPENDENT_AMBULATORY_CARE_PROVIDER_SITE_OTHER): Payer: 59 | Admitting: Psychology

## 2017-05-29 DIAGNOSIS — F408 Other phobic anxiety disorders: Secondary | ICD-10-CM | POA: Diagnosis not present

## 2017-06-05 ENCOUNTER — Ambulatory Visit (INDEPENDENT_AMBULATORY_CARE_PROVIDER_SITE_OTHER): Payer: 59 | Admitting: Psychology

## 2017-06-05 DIAGNOSIS — F408 Other phobic anxiety disorders: Secondary | ICD-10-CM

## 2017-06-15 ENCOUNTER — Ambulatory Visit (INDEPENDENT_AMBULATORY_CARE_PROVIDER_SITE_OTHER): Payer: 59 | Admitting: Psychology

## 2017-06-15 DIAGNOSIS — F408 Other phobic anxiety disorders: Secondary | ICD-10-CM

## 2017-06-26 ENCOUNTER — Ambulatory Visit (INDEPENDENT_AMBULATORY_CARE_PROVIDER_SITE_OTHER): Payer: 59 | Admitting: Psychology

## 2017-06-26 DIAGNOSIS — F408 Other phobic anxiety disorders: Secondary | ICD-10-CM | POA: Diagnosis not present

## 2017-07-07 ENCOUNTER — Telehealth: Payer: Self-pay | Admitting: Internal Medicine

## 2017-07-07 NOTE — Telephone Encounter (Signed)
Pt sent following on MyChart:  Hi Dr. Silvio Pate,    I am currently experience an abnormal case of constipation, and I believe my stool has become impacted. I have first noticed the problem around June 27th. I have used the following over-the-counter medications with little success (went a few times but with strenuous effort and incomplete bowel movement):  * Bisacodyl (5 mg / stimulant / generic)  * Docusate sodium (250 mg / emollient / generic)  * Sennosides (25 mg / stimulant / generic)  * Polyethylene glycol 3350 (17 g / osmotic / generic)  * Glycerin (2 g / rectal suppository / Fleet)    The only other solution I have within my reach is Fleet's saline enema. A single dose is 118 mL of solution containing 4.4 grams of sodium, 19 grams of monobasic sodium phosphate, and 7 grams of dibasic sodium phosphate. I became hesitant to use this as my last resort option after reading about the possibility of hyperphosphatemia, hypocalcemia, and/or renal problems when using sodium phosphate.    Please advise.      Regards,    Charles Hayden

## 2017-07-08 NOTE — Telephone Encounter (Signed)
I sent him a MyChart message since that is how he initially contacted [place].

## 2017-07-08 NOTE — Telephone Encounter (Signed)
Please call him I would recommend just using the miralax (polyethylene glycol), 1 capful in a fall glass of water (or prune juice) every 2 hours for at least 4-5 doses. If he hasn't gone at that time (might want to wait till tomorrow on this), he could try a biscodyl suppository or the enema he mentioned It sounds like he should take the miralax daily after that to prevent this from happening again.

## 2017-07-09 NOTE — Telephone Encounter (Signed)
Spoke to him by phone He did say he took 4 doses of miralax yesterday and had no effect. He didn't do anything today  Sounds okay No pain No sig stool in ~2 weeks  PLAN:  Repeat at least 4 doses of miralax or more tonight Try the biscodyl tab in AM If not effective, will add on to schedule to check  Check with him tomorrow morning

## 2017-07-10 ENCOUNTER — Ambulatory Visit (INDEPENDENT_AMBULATORY_CARE_PROVIDER_SITE_OTHER): Payer: 59 | Admitting: Psychology

## 2017-07-10 ENCOUNTER — Ambulatory Visit (INDEPENDENT_AMBULATORY_CARE_PROVIDER_SITE_OTHER)
Admission: RE | Admit: 2017-07-10 | Discharge: 2017-07-10 | Disposition: A | Discharge status: Home / Self Care | Payer: 59 | Source: Ambulatory Visit | Attending: Internal Medicine | Admitting: Internal Medicine

## 2017-07-10 ENCOUNTER — Ambulatory Visit (INDEPENDENT_AMBULATORY_CARE_PROVIDER_SITE_OTHER): Payer: 59 | Admitting: Internal Medicine

## 2017-07-10 ENCOUNTER — Encounter: Payer: Self-pay | Admitting: Internal Medicine

## 2017-07-10 VITALS — BP 108/80 | HR 62 | Temp 98.4°F | Wt 129.0 lb

## 2017-07-10 DIAGNOSIS — K5900 Constipation, unspecified: Secondary | ICD-10-CM

## 2017-07-10 DIAGNOSIS — F408 Other phobic anxiety disorders: Secondary | ICD-10-CM

## 2017-07-10 NOTE — Patient Instructions (Signed)
Please take the miralax twice a day on an ongoing basis. You can adjust this as needed to try to achieve a comfortable regular bowel routine. Please avoid the enema and biscodyl for now. I do not think the docusate is much help. You could add prune juice--- or 1-2 senna-s daily if you like.

## 2017-07-10 NOTE — Telephone Encounter (Signed)
Left another message to call the office

## 2017-07-10 NOTE — Assessment & Plan Note (Addendum)
Ongoing issues KUB reassuring--no dilated bowel and only moderate stool burden I recommended miralax regularly--stay away from the cathartics for now

## 2017-07-10 NOTE — Telephone Encounter (Signed)
I left a message on his cell vm to see how he was doing. I will also send him a MyChart message.

## 2017-07-10 NOTE — Progress Notes (Signed)
   Subjective:    Patient ID: Charles Hayden, male    DOB: April 26, 1996, 21 y.o.   MRN: [id number]  HPI Here due to ongoing constipation for over 1 week See mychart notes as well  Normally only goes 2-3 times a week No problems emptying when those times came Now about 2 weeks --only going very small amounts 2 days ago--had feeling like something stuck in there  Used miralax multiple times over the past 2 days Some watery stool and passed some stool 3-4 times over the past 2 days Did try enema-- may have helped some Doesn't feel like he has emptied out though No sense of urgency now  No blood in stool  No current outpatient prescriptions on file prior to visit.   No current facility-administered medications on file prior to visit.     Allergies  Allergen Reactions  . Food     Tested via allergist, allergic peanuts...given Epi pen 04/23/11  . Pollen Extract     Intradermal testing, 04/23/11    Past Medical History:  Diagnosis Date  . Allergic rhinitis due to pollen     Past Surgical History:  Procedure Laterality Date  . APPENDECTOMY  03/31/2016   Dr. Dahlia Byes  . LAPAROSCOPIC APPENDECTOMY N/A 03/31/2016   Procedure: APPENDECTOMY LAPAROSCOPIC;  Surgeon: Jules Husbands, MD;  Location: [facility] ORS;  Service: General;  Laterality: N/A;    Family History  Problem Relation Age of Onset  . Alzheimer's disease Maternal Grandfather   . Heart disease Neg Hx   . Diabetes Neg Hx      Review of Systems  Appetite still fine No N/V Weight is stable     Objective:   Physical Exam  Abdominal: Soft. Bowel sounds are normal. He exhibits no distension. There is no tenderness. There is no rebound and no guarding.          Assessment & Plan:

## 2017-07-10 NOTE — Telephone Encounter (Signed)
Spoke to pt. Appt with Dr Silvio Pate today

## 2017-08-07 ENCOUNTER — Ambulatory Visit (INDEPENDENT_AMBULATORY_CARE_PROVIDER_SITE_OTHER): Payer: 59 | Admitting: Psychology

## 2017-08-07 DIAGNOSIS — F408 Other phobic anxiety disorders: Secondary | ICD-10-CM | POA: Diagnosis not present

## 2017-08-21 ENCOUNTER — Ambulatory Visit (INDEPENDENT_AMBULATORY_CARE_PROVIDER_SITE_OTHER): Payer: 59 | Admitting: Psychology

## 2017-08-21 DIAGNOSIS — F408 Other phobic anxiety disorders: Secondary | ICD-10-CM

## 2017-09-04 ENCOUNTER — Ambulatory Visit (INDEPENDENT_AMBULATORY_CARE_PROVIDER_SITE_OTHER): Payer: 59 | Admitting: Psychology

## 2017-09-04 DIAGNOSIS — F408 Other phobic anxiety disorders: Secondary | ICD-10-CM

## 2017-09-18 ENCOUNTER — Ambulatory Visit (INDEPENDENT_AMBULATORY_CARE_PROVIDER_SITE_OTHER): Payer: 59 | Admitting: Psychology

## 2017-09-18 DIAGNOSIS — F408 Other phobic anxiety disorders: Secondary | ICD-10-CM | POA: Diagnosis not present

## 2017-10-02 ENCOUNTER — Ambulatory Visit (INDEPENDENT_AMBULATORY_CARE_PROVIDER_SITE_OTHER): Payer: 59 | Admitting: Psychology

## 2017-10-02 DIAGNOSIS — F408 Other phobic anxiety disorders: Secondary | ICD-10-CM | POA: Diagnosis not present

## 2017-10-16 ENCOUNTER — Ambulatory Visit (INDEPENDENT_AMBULATORY_CARE_PROVIDER_SITE_OTHER): Payer: 59 | Admitting: Psychology

## 2017-10-16 DIAGNOSIS — F408 Other phobic anxiety disorders: Secondary | ICD-10-CM

## 2017-10-21 IMAGING — CT CT ABD-PELV W/ CM
1 of 2 series · 15 of 32 positions shown, 19 images · IV contrast (iopamidol)
Comparison: None.

CLINICAL DATA: 19-year-old male with mid to lower abdominal pain
starting yesterday. Vomiting. Initial encounter.

EXAM:
CT ABDOMEN AND PELVIS WITH CONTRAST
TECHNIQUE: Multidetector CT imaging of the abdomen and pelvis was performed
using the standard protocol following bolus administration of
intravenous contrast.
CONTRAST:  100mL MB49IC-KEE IOPAMIDOL (MB49IC-KEE) INJECTION 61%

[Series 2: routine abd pel with · axial · 0.58mm/px · z∈[-493,-58]mm · 15 of 95 slices shown, 19 images]
[im 4/95  soft-tissue]
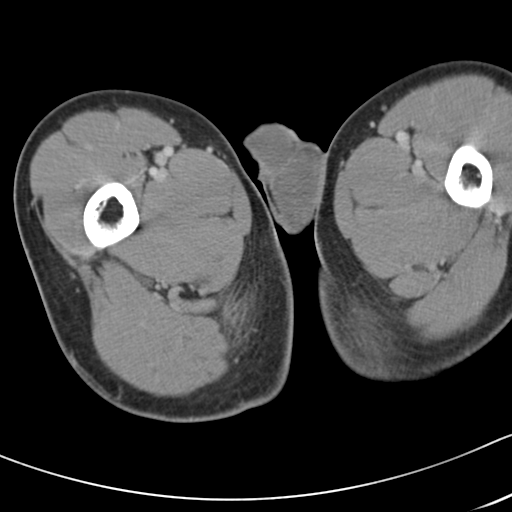
[im 4/95  bone]
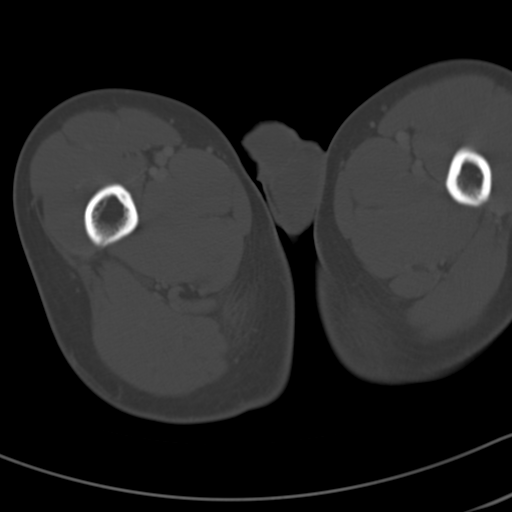
[im 12/95  soft-tissue]
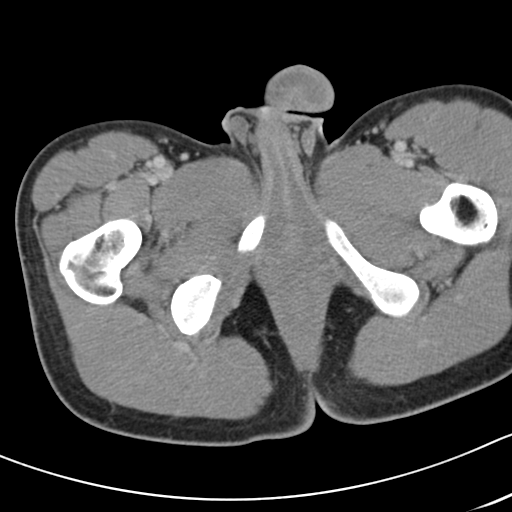
[im 20/95  soft-tissue]
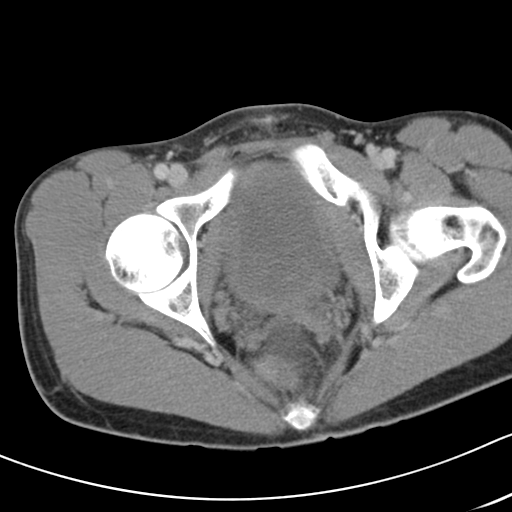
[im 28/95  soft-tissue]
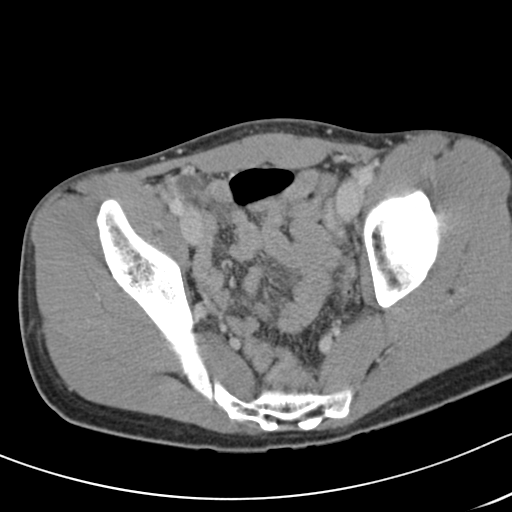
[im 32/95  soft-tissue]
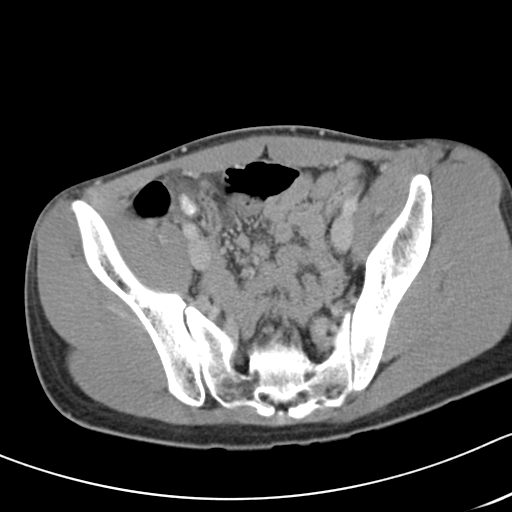
[im 40/95  soft-tissue]
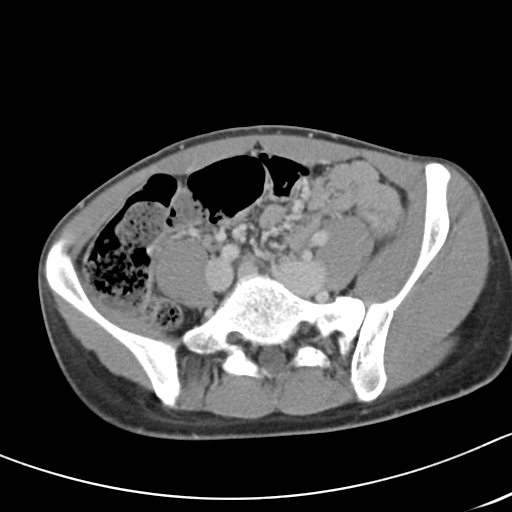
[im 48/95  soft-tissue]
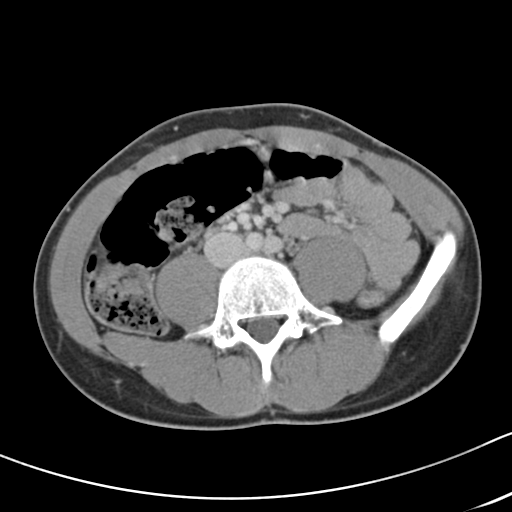
[im 55/95  soft-tissue]
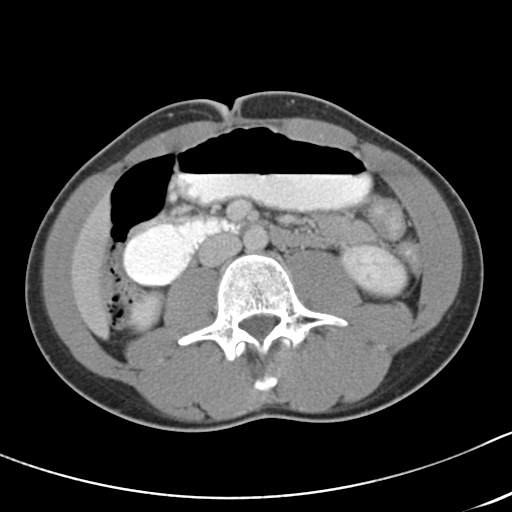
[im 63/95  soft-tissue]
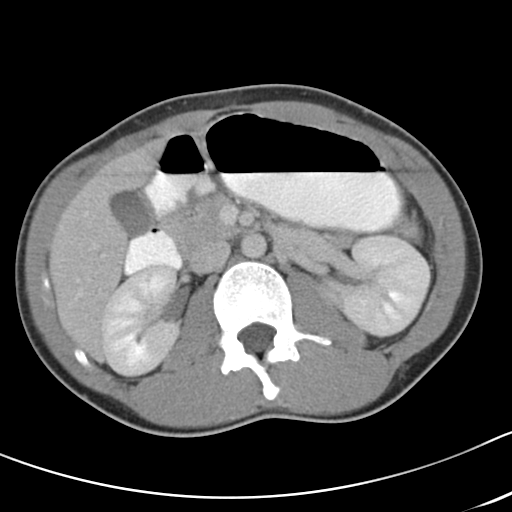
[im 63/95  bone]
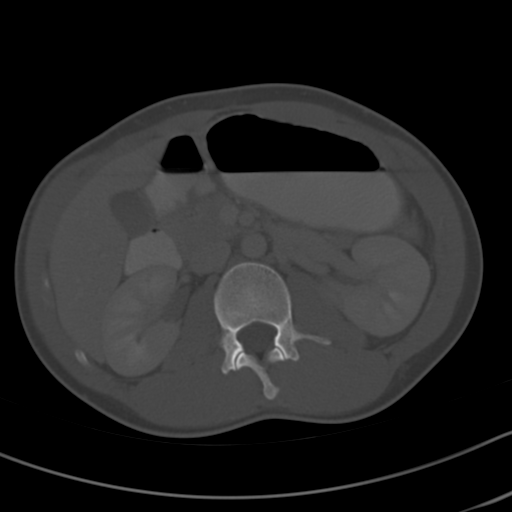
[im 67/95  soft-tissue]
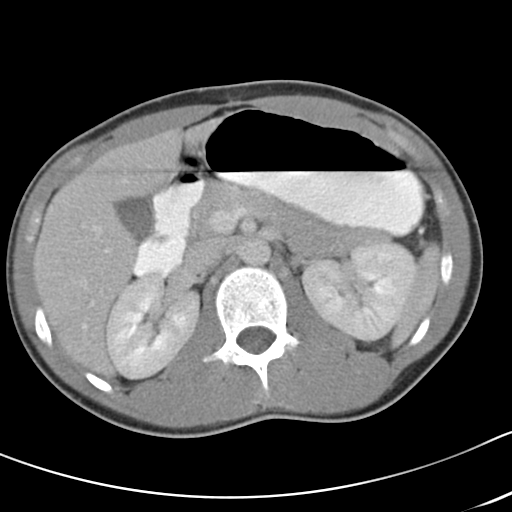
[im 75/95  soft-tissue]
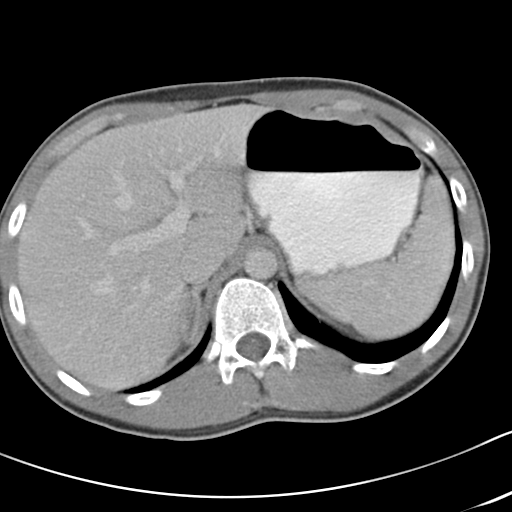
[im 79/95  lung]
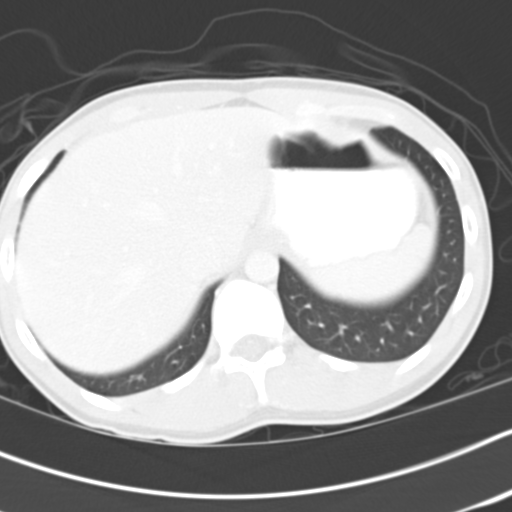
[im 83/95  soft-tissue]
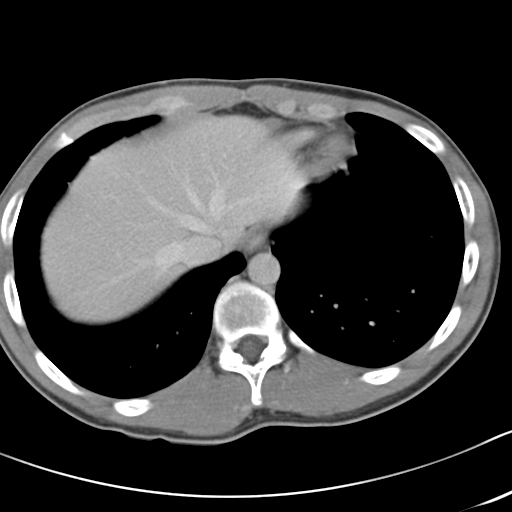
[im 83/95  lung]
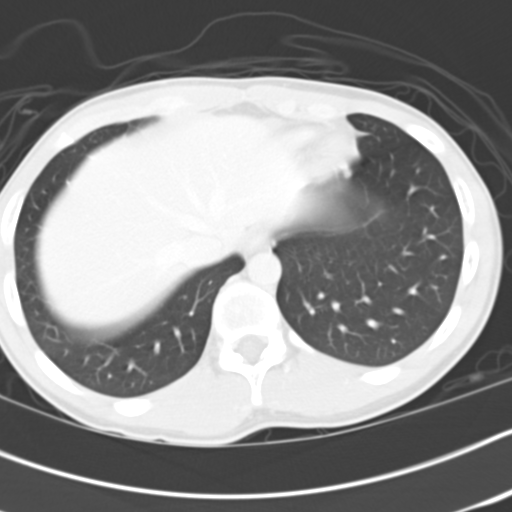
[im 87/95  lung]
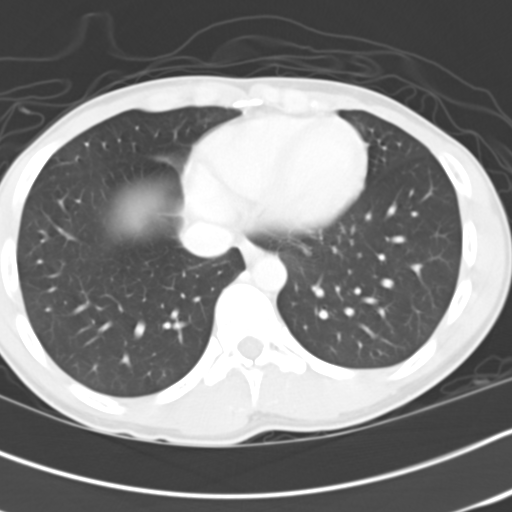
[im 91/95  soft-tissue]
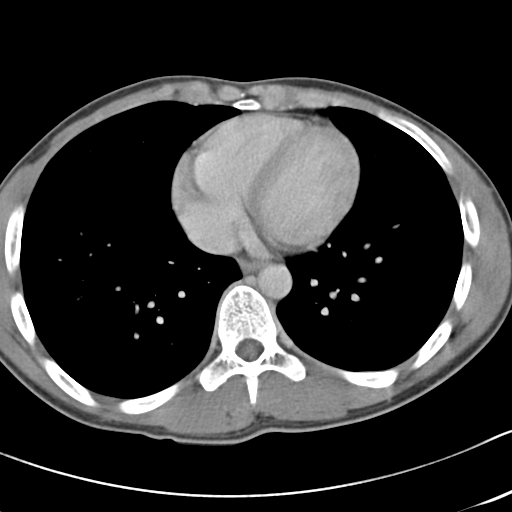
[im 91/95  lung]
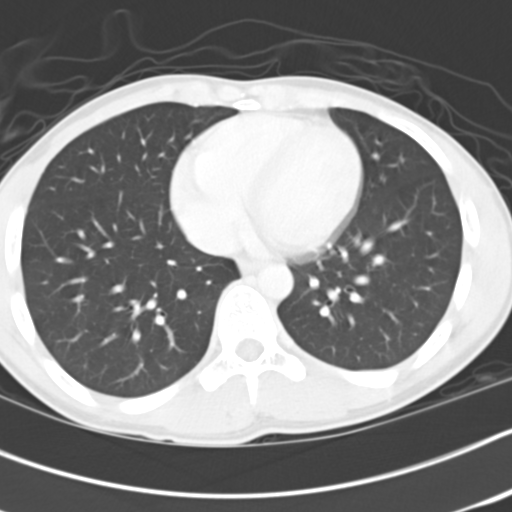

[15 of 32 positions shown; findings below may reference images not displayed]

FINDINGS: Enlarged inflamed appendix containing several stones.

Trace amount of free fluid in the pelvis most likely related to
appendicitis. No well-defined deep drainable abscess currently
detected. No free intraperitoneal air.

The stomach and duodenal are prominent to level where duodenum
extends beneath the superior mesenteric artery. This appearance may
be related to patient's supine position rather than true SMA
syndrome.

Liver, spleen, gallbladder, pancreas, kidneys and adrenal glands
unremarkable. No abdominal aortic aneurysm. Top-normal size lymph
nodes probably reactive in the right lower quadrant region.

No worrisome osseous abnormality.  Lung bases clear.
IMPRESSION: Enlarged inflamed appendix containing several stones (appendicitis).

Trace amount of free fluid in the pelvis most likely related to
appendicitis. No well-defined deep drainable abscess currently
detected. No free intraperitoneal air.

The stomach and duodenal are prominent to level where duodenum
extends beneath the superior mesenteric artery. This appearance may
be related to patient's supine position rather than true SMA
syndrome.

These results were called by telephone at the time of interpretation
on 03/31/2016 at [DATE] to Dr. BENRABAH ETOIL , who verbally
acknowledged these results.

## 2017-10-30 ENCOUNTER — Ambulatory Visit: Payer: Self-pay | Admitting: Psychology

## 2017-11-13 ENCOUNTER — Ambulatory Visit: Payer: Self-pay | Admitting: Psychology

## 2017-11-27 ENCOUNTER — Ambulatory Visit: Payer: Self-pay | Admitting: Psychology

## 2017-12-11 ENCOUNTER — Ambulatory Visit (INDEPENDENT_AMBULATORY_CARE_PROVIDER_SITE_OTHER): Payer: 59 | Admitting: Psychology

## 2017-12-11 DIAGNOSIS — F408 Other phobic anxiety disorders: Secondary | ICD-10-CM | POA: Diagnosis not present

## 2017-12-25 ENCOUNTER — Ambulatory Visit: Payer: Self-pay | Admitting: Psychology

## 2018-01-08 ENCOUNTER — Ambulatory Visit (INDEPENDENT_AMBULATORY_CARE_PROVIDER_SITE_OTHER): Payer: 59 | Admitting: Psychology

## 2018-01-08 DIAGNOSIS — F408 Other phobic anxiety disorders: Secondary | ICD-10-CM

## 2018-01-22 ENCOUNTER — Ambulatory Visit (INDEPENDENT_AMBULATORY_CARE_PROVIDER_SITE_OTHER): Payer: 59 | Admitting: Psychology

## 2018-01-22 DIAGNOSIS — F408 Other phobic anxiety disorders: Secondary | ICD-10-CM

## 2018-02-01 ENCOUNTER — Ambulatory Visit (INDEPENDENT_AMBULATORY_CARE_PROVIDER_SITE_OTHER): Payer: 59 | Admitting: Psychology

## 2018-02-01 DIAGNOSIS — F408 Other phobic anxiety disorders: Secondary | ICD-10-CM | POA: Diagnosis not present

## 2018-02-05 ENCOUNTER — Ambulatory Visit: Payer: 59 | Admitting: Psychology

## 2018-02-13 ENCOUNTER — Encounter (HOSPITAL_COMMUNITY): Payer: Self-pay | Admitting: Psychiatry

## 2018-02-13 ENCOUNTER — Ambulatory Visit (INDEPENDENT_AMBULATORY_CARE_PROVIDER_SITE_OTHER): Payer: 59 | Admitting: Psychiatry

## 2018-02-13 VITALS — BP 112/64 | HR 65 | Ht 69.0 in | Wt 134.4 lb

## 2018-02-13 DIAGNOSIS — R51 Headache: Secondary | ICD-10-CM

## 2018-02-13 DIAGNOSIS — F419 Anxiety disorder, unspecified: Secondary | ICD-10-CM

## 2018-02-13 DIAGNOSIS — F4010 Social phobia, unspecified: Secondary | ICD-10-CM | POA: Insufficient documentation

## 2018-02-13 MED ORDER — ESCITALOPRAM OXALATE 20 MG PO TABS: 20.0000 mg | ORAL_TABLET | Freq: Every day | ORAL | 2 refills | Status: DC

## 2018-02-13 MED ORDER — PROPRANOLOL HCL 10 MG PO TABS: 10.0000 mg | ORAL_TABLET | Freq: Every day | ORAL | 0 refills | Status: DC | PRN

## 2018-02-13 NOTE — Progress Notes (Signed)
Psychiatric Initial Adult Assessment   Patient Identification: [facility] MRN:  [id number] Date of Evaluation:  02/13/2018 Referral Source: self Chief Complaint:   Chief Complaint    Establish Care; Anxiety      Visit Diagnosis:    ICD-10-CM   1. Social anxiety disorder F40.10 propranolol (INDERAL) 10 MG tablet    escitalopram (LEXAPRO) 20 MG tablet    History of Present Illness:  Pt here to establish care. He has been in therapy for 2 yrs but his anxiety is ongoing and he wants to try medication. He wants to be able to be to present in his classes with minimal anxiety- he talks too much or freezes and can't think straight. He also wants to be able to graduate and get a job. On a daily basis he experiences he is "functioning very fine. As in I can act normal without constant worry". Pt states he is mostly a loner and doesn't connect with others. He avoids people and his parents when possible. He doesn't really have any friends. It makes him anxious to interact with anyone. He takes Theanine 400mg  BID or TID. He finds it numbs his emotions and sometimes takes the "jitter off the caffeine and it calms me".  Pt started a few years ago after finding it on [employer].  Patient complains of anxiety disorder.  He has the following symptoms: difficulty concentrating, feelings of losing control, palpitations, paresthesias, racing thoughts, shortness of breath when he has to interact with people.  Onset of symptoms was in HS. He denies current suicidal and homicidal ideation. Family history significant for no psychiatric illness.Possible organic causes contributing are: none. Risk factors: none Previous treatment includes none and individual therapy.  He complains of the following side effects from the treatment: none. He has no real interest in anything. He has chronic low self esteem. He is willing to stop Theanine and cut back on caffeine use. He is sleeping 6-8 hrs/night and eating 2 meals a day.    Associated Signs/Symptoms: Depression Symptoms:  depressed mood, anhedonia, feelings of worthlessness/guilt, hopelessness, (Hypo) Manic Symptoms:  negative Anxiety Symptoms:  Social Anxiety, pt denies GAD, OCD and panic attacks Psychotic Symptoms:  negative PTSD Symptoms:Negative   Past Psychiatric History:  Dx: Depression; Autistic spectrum  Meds: denies Previous psychiatrist/therapist: Dr. Cheryln Manly for therapy q2 weeks. Started about 2 yrs ago Other treatments: denies Hospitalizations: denies SIB: denies Suicide attempts: denies Hx of violent behavior towards others: denies Current access to guns: yes- in his house. States he doesn't know how to operate them Hx of abuse: Military Hx: denies Hx of Seizures: seizures as kid in [place] per what his mom tells him. None since the age 72 Hx of TBI: denies  Substance Abuse History in the last 12 months:  No.  Consequences of Substance Abuse: Negative   Past Medical History:  Past Medical History:  Diagnosis Date  . Allergic rhinitis due to pollen     Past Surgical History:  Procedure Laterality Date  . APPENDECTOMY  03/31/2016   Dr. Dahlia Byes  . LAPAROSCOPIC APPENDECTOMY N/A 03/31/2016   Procedure: APPENDECTOMY LAPAROSCOPIC;  Surgeon: Jules Husbands, MD;  Location: [facility] ORS;  Service: General;  Laterality: N/A;    Family Psychiatric and Medical History:  Family History  Problem Relation Age of Onset  . Alzheimer's disease Maternal Grandfather   . Heart disease Neg Hx   . Diabetes Neg Hx     Social History:   Social History   Socioeconomic History  .  Marital status: Single    Spouse name: None  . Number of children: 0  . Years of education: None  . Highest education level: None  Social Needs  . Financial resource strain: None  . Food insecurity - worry: None  . Food insecurity - inability: None  . Transportation needs - medical: None  . Transportation needs - non-medical: None  Occupational History  . None   Tobacco Use  . Smoking status: Never Smoker  . Smokeless tobacco: Never Used  Substance and Sexual Activity  . Alcohol use: No  . Drug use: No  . Sexual activity: None  Other Topics Concern  . None  Social History Narrative   Attended [employer] for [employer]. Now going to [employer] for [employer]. Lives in [place] with his parents. He has 1/2 brother in [place]. Pt used to work with is [employer] but now is only going to school.     Allergies:   Allergies  Allergen Reactions  . Food     Tested via allergist, allergic peanuts...given Epi pen 04/23/11  . Pollen Extract     Intradermal testing, 4/97/02    Metabolic Disorder Labs: No results found for: HGBA1C, MPG No results found for: PROLACTIN No results found for: CHOL, TRIG, HDL, CHOLHDL, VLDL, LDLCALC   Current Medications: Current Outpatient Medications  Medication Sig Dispense Refill  . 5-Hydroxytryptophan (5-HTP) 100 MG CAPS Take by mouth.     No current facility-administered medications for this visit.     Musculoskeletal: Strength & Muscle Tone: within normal limits Gait & Station: normal Patient leans: N/A  Psychiatric Specialty Exam: Review of Systems  HENT: Negative for congestion, ear pain, nosebleeds, sinus pain and sore throat.   Gastrointestinal: Negative for abdominal pain, heartburn, nausea and vomiting.  Neurological: Positive for headaches. Negative for dizziness, tremors, sensory change, speech change and seizures.    Blood pressure 112/64, pulse 65, height 5\' 9"  (1.753 m), weight 134 lb 6.4 oz (61 kg).Body mass index is 19.85 kg/m.  General Appearance: Fairly Groomed  Eye Contact:  Minimal  Speech:  Clear and Coherent and Normal Rate  Volume:  Normal  Mood:  Anxious  Affect:  Congruent  Thought Process:  Goal Directed and Descriptions of Associations: Intact  Orientation:  Full (Time, Place, and Person)  Thought Content:  Logical  Suicidal Thoughts:  No  Homicidal Thoughts:   No  Memory:  Immediate;   Good Recent;   Good Remote;   Good  Judgement:  Fair  Insight:  Fair  Psychomotor Activity:  Normal  Concentration:  Concentration: Good and Attention Span: Good  Recall:  Good  Fund of Knowledge:Good  Language: Good  Akathisia:  No  Handed:  Right  AIMS (if indicated):  n/a  Assets:  Desire for Improvement Housing Transportation Vocational/Educational  ADL's:  Intact  Cognition: WNL  Sleep:  good    Assessment: Social anxiety disorder  Confidentiality and exclusions reviewed with pt who verbalized understanding.   Medication management with supportive therapy. Risks and benefits, side effects and alternative treatment options discussed with patient. Pt was given an opportunity to ask questions about medication, illness, and treatment. All current psychiatric medications have been reviewed and discussed with the patient and adjusted as clinically appropriate. The patient has been provided an accurate and updated list of the medications being now prescribed. Patient expressed understanding of how their medications were to be used.  Pt verbalized understanding and verbal consent obtained for treatment.   Meds:  d/c Theanine- pt will taper off over next one week D/c 5HTP Start Lexapro 20mg  po qD for social anxiety Start Propanolol 10mg  po qd prn anxiety  Labs: none  Therapy: brief supportive therapy provided. Discussed psychosocial stressors in detail.   Recommended pt cut back on caffeine use and educated on caffeine's effect on anxiety  Consultations:Encouraged to follow up with therapy Encouraged to follow up with PCP as needed  Pt denies SI and is at an acute low risk for suicide. Patient told to call clinic if any problems occur. Patient advised to go to ER if they should develop SI/HI, side effects, or if symptoms worsen. Has crisis numbers to call if needed. Pt verbalized understanding.  F/up in 2 weeks or sooner if needed   Charlcie Cradle, MD 2/16/20191:26 PM

## 2018-02-19 ENCOUNTER — Ambulatory Visit (INDEPENDENT_AMBULATORY_CARE_PROVIDER_SITE_OTHER): Payer: 59 | Admitting: Psychology

## 2018-02-19 DIAGNOSIS — F408 Other phobic anxiety disorders: Secondary | ICD-10-CM

## 2018-03-01 ENCOUNTER — Ambulatory Visit (INDEPENDENT_AMBULATORY_CARE_PROVIDER_SITE_OTHER): Payer: 59 | Admitting: Psychiatry

## 2018-03-01 ENCOUNTER — Encounter (HOSPITAL_COMMUNITY): Payer: Self-pay | Admitting: Psychiatry

## 2018-03-01 VITALS — BP 122/68 | HR 67 | Ht 69.0 in | Wt 130.4 lb

## 2018-03-01 DIAGNOSIS — Z560 Unemployment, unspecified: Secondary | ICD-10-CM

## 2018-03-01 DIAGNOSIS — R4581 Low self-esteem: Secondary | ICD-10-CM

## 2018-03-01 DIAGNOSIS — F4010 Social phobia, unspecified: Secondary | ICD-10-CM

## 2018-03-01 DIAGNOSIS — Z79899 Other long term (current) drug therapy: Secondary | ICD-10-CM

## 2018-03-01 DIAGNOSIS — F411 Generalized anxiety disorder: Secondary | ICD-10-CM | POA: Diagnosis not present

## 2018-03-01 DIAGNOSIS — Z810 Family history of intellectual disabilities: Secondary | ICD-10-CM

## 2018-03-01 MED ORDER — ESCITALOPRAM OXALATE 20 MG PO TABS: 20.0000 mg | ORAL_TABLET | Freq: Every day | ORAL | 0 refills | Status: DC

## 2018-03-01 NOTE — Addendum Note (Signed)
Addended by: Dennie Maizes E on: 03/01/2018 11:15 AM   Modules accepted: Orders

## 2018-03-01 NOTE — Progress Notes (Signed)
[facility]den  03/01/2018 10:19 AM Charles [facility]den  MRN:  [id number]  Chief Complaint: I am taking Lexapro 20 mg and I am feeling less anxious.  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 Doyne Hayden 2 weeks ago as initial evaluation.  He is having symptoms of increased anxiety, nervousness, moments of freezing up and cannot think straight.  He is afraid to leave his house and a white people.  He has limited social network.  He find himself very numb and emotional S around people.  He admitted difficulty concentration, feeling losing control, paresthesia, racing thoughts, shortness of breath and palpitation when he interact with the people.  Patient feel improvement with the Lexapro.  He is less anxious and less nervous and he was surprised that recently he went to buy a suit with his parents and it went well.  He took only one time propanolol when he was very anxious and that helped him.  Patient is still has limited social network.  He tried to keep himself busy at school.  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  He is hoping to graduate this May.  He also trying to find a job.  Patient is single and currently not in any relationship.  He admitted low self-esteem but hoping that medicine and therapy will help him.  He is seeing Dr. Cheryln Hayden every 2-week for CBT.  Patient denies any agitation, anger, mania, psychosis, hallucination.  He has no tremors, shakes or any EPS.  His energy level is improved.  His appetite also improved and his vital signs are stable.  Patient denies drinking alcohol or using any illegal substances.  Patient lives with his mother and his stepfather.  His biological father lives in [place] and is hoping to see him and his step brother on his graduation.     Visit Diagnosis:    ICD-10-CM   1. Social anxiety disorder F40.10 escitalopram (LEXAPRO) 20 MG tablet    Past Psychiatric History:  Patient denies any history of  psychiatric inpatient treatment or any suicidal attempt.  He is seeing Dr. Cheryln Hayden for past 2 years for CBT.  Patient denies any history of psychosis, mania, hallucination, agitation, OCD or any PTSD.  Patient denies any history of violent behavior.  He reported history of seizure as a care in [place] but do not remember the details.  Since moved to [place] at age 9 he has no seizures.  Past Medical History:  Past Medical History:  Diagnosis Date  . Allergic rhinitis due to pollen     Past Surgical History:  Procedure Laterality Date  . APPENDECTOMY  03/31/2016   Dr. Dahlia Hayden  . LAPAROSCOPIC APPENDECTOMY N/A 03/31/2016   Procedure: APPENDECTOMY LAPAROSCOPIC;  Surgeon: [place] Husbands, MD;  Location: [facility] ORS;  Service: General;  Laterality: N/A;    Family Psychiatric History: Reviewed.  Family History:  Family History  Problem Relation Age of Onset  . Alzheimer's disease Maternal Grandfather   . Heart disease Neg Hx   . Diabetes Neg Hx     Social History:  Social History   Socioeconomic History  . Marital status: Single    Spouse name: None  . Number of children: 0  . Years of education: None  . Highest education level: None  Social Needs  . Financial resource strain: None  . Food insecurity - worry: None  . Food insecurity - inability: None  . Transportation needs - medical:  None  . Transportation needs - non-medical: None  Occupational History  . None  Tobacco Use  . Smoking status: Never Smoker  . Smokeless tobacco: Never Used  Substance and Sexual Activity  . Alcohol use: No  . Drug use: No  . Sexual activity: None  Other Topics Concern  . None  Social History Narrative   Attended [place] [employer] for [employer]. Now going to [employer] for [employer]. Lives in [place] with his parents. He has 1/2 brother in [place]. Pt used to work with is [employer] but now is only going to school.     Allergies:  Allergies  Allergen Reactions  . Food     Tested via  allergist, allergic peanuts...given Epi pen 04/23/11  . Pollen Extract     Intradermal testing, 7/74/12    Metabolic Disorder Labs: No results found for: HGBA1C, MPG No results found for: PROLACTIN No results found for: CHOL, TRIG, HDL, CHOLHDL, VLDL, LDLCALC No results found for: TSH  Therapeutic Level Labs: No results found for: LITHIUM No results found for: VALPROATE No components found for:  CBMZ  Current Medications: Current Outpatient Medications  Medication Sig Dispense Refill  . escitalopram (LEXAPRO) 20 MG tablet Take 1 tablet (20 mg total) by mouth daily. 30 tablet 2  . propranolol (INDERAL) 10 MG tablet Take 1 tablet (10 mg total) by mouth daily as needed. 30 tablet 0   No current facility-administered medications for this visit.      Musculoskeletal: Strength & Muscle Tone: within normal limits Gait & Station: normal Patient leans: N/A  Psychiatric Specialty Exam: Review of Systems  Constitutional: Negative.   HENT: Negative.   Eyes: Negative.   Respiratory: Negative.   Cardiovascular: Negative.   Gastrointestinal: Negative.   Genitourinary: Negative.   Musculoskeletal: Negative.   Skin: Negative.   Neurological: Negative.     Blood pressure 122/68, pulse 67, height 5\' 9"  (1.753 m), weight 130 lb 6.4 oz (59.1 kg).Body mass index is 19.26 kg/m.  General Appearance: Casual  Eye Contact:  Fair  Speech:  Clear and Coherent  Volume:  Normal  Mood:  Euthymic  Affect:  Appropriate  Thought Process:  Goal Directed  Orientation:  Full (Time, Place, and Person)  Thought Content: Logical   Suicidal Thoughts:  No  Homicidal Thoughts:  No  Memory:  Immediate;   Good Recent;   Good Remote;   Good  Judgement:  Good  Insight:  Good  Psychomotor Activity:  Normal  Concentration:  Concentration: Good and Attention Span: Good  Recall:  Good  Fund of Knowledge: Good  Language: Good  Akathisia:  No  Handed:  Right  AIMS (if indicated): not done  Assets:   Communication Skills Desire for Improvement Housing Resilience Social Support  ADL's:  Intact  Cognition: WNL  Sleep:  Good   Screenings:   Assessment and Plan: Social anxiety disorder.  Generalized anxiety disorder.  I review collateral information from other providers, psychosocial stressors and current medication.  She is doing better on Lexapro 20 mg daily.  He is not taking propanolol on a regular basis.  We discussed medication side effect especially reminded that Inderal is antihypertensive medication and he need to watch after taking the medication his blood pressure.  At this time patient does not have any side effects from Lexapro.  Encouraged to watch his calorie intake, regular exercise and healthy lifestyle.  Encouraged to continue CBT with Dr. Cheryln Hayden.  Patient is excited about graduation in May.  Discussed medication side effects and benefits.  Patient was given an opportunity to talk about his diagnosis, treatment and long-term prognosis.  I would also do blood work including CBC, CMP, hemoglobin C and TSH.  Recommended to call us back if he has any question, concern if you feel worsening of the symptoms.  Time spent 25 minutes.  More than 50% of the time spent in psychoeducation, counseling, coronation of care.  Follow-up in 3 months.   Kathlee Nations, MD 03/01/2018, 10:19 AM

## 2018-03-02 LAB — CBC WITH DIFFERENTIAL/PLATELET
BASOS: 0 %
Basophils Absolute: 0 10*3/uL (ref 0.0–0.2)
EOS (ABSOLUTE): 0.1 10*3/uL (ref 0.0–0.4)
EOS: 3 %
HEMATOCRIT: 42.2 % (ref 37.5–51.0)
HEMOGLOBIN: 14.4 g/dL (ref 13.0–17.7)
IMMATURE GRANULOCYTES: 0 %
Immature Grans (Abs): 0 10*3/uL (ref 0.0–0.1)
LYMPHS ABS: 1.9 10*3/uL (ref 0.7–3.1)
Lymphs: 42 %
MCH: 29.2 pg (ref 26.6–33.0)
MCHC: 34.1 g/dL (ref 31.5–35.7)
MCV: 86 fL (ref 79–97)
MONOCYTES: 8 %
Monocytes Absolute: 0.4 10*3/uL (ref 0.1–0.9)
NEUTROS PCT: 47 %
Neutrophils Absolute: 2.2 10*3/uL (ref 1.4–7.0)
Platelets: 274 10*3/uL (ref 150–379)
RBC: 4.93 x10E6/uL (ref 4.14–5.80)
RDW: 13.1 % (ref 12.3–15.4)
WBC: 4.6 10*3/uL (ref 3.4–10.8)

## 2018-03-02 LAB — COMPREHENSIVE METABOLIC PANEL
ALBUMIN: 4.8 g/dL (ref 3.5–5.5)
ALT: 35 IU/L (ref 0–44)
AST: 31 IU/L (ref 0–40)
Albumin/Globulin Ratio: 1.5 (ref 1.2–2.2)
Alkaline Phosphatase: 74 IU/L (ref 39–117)
BUN / CREAT RATIO: 16 (ref 9–20)
BUN: 14 mg/dL (ref 6–20)
Bilirubin Total: 0.6 mg/dL (ref 0.0–1.2)
CALCIUM: 9.7 mg/dL (ref 8.7–10.2)
CO2: 24 mmol/L (ref 20–29)
CREATININE: 0.85 mg/dL (ref 0.76–1.27)
Chloride: 101 mmol/L (ref 96–106)
GFR calc Af Amer: 143 mL/min/{1.73_m2} (ref >59)
GFR, EST NON AFRICAN AMERICAN: 124 mL/min/{1.73_m2} (ref >59)
GLOBULIN, TOTAL: 3.1 g/dL (ref 1.5–4.5)
GLUCOSE: 83 mg/dL (ref 65–99)
Potassium: 4.5 mmol/L (ref 3.5–5.2)
SODIUM: 141 mmol/L (ref 134–144)
Total Protein: 7.9 g/dL (ref 6.0–8.5)

## 2018-03-02 LAB — TSH: TSH: 1.56 u[IU]/mL (ref 0.450–4.500)

## 2018-03-02 LAB — HEMOGLOBIN A1C
Est. average glucose Bld gHb Est-mCnc: 114 mg/dL
Hgb A1c MFr Bld: 5.6 % (ref 4.8–5.6)

## 2018-03-15 ENCOUNTER — Ambulatory Visit: Payer: 59 | Admitting: Psychology

## 2018-03-19 ENCOUNTER — Ambulatory Visit: Payer: 59 | Admitting: Psychology

## 2018-04-02 ENCOUNTER — Ambulatory Visit (INDEPENDENT_AMBULATORY_CARE_PROVIDER_SITE_OTHER): Payer: 59 | Admitting: Psychology

## 2018-04-02 DIAGNOSIS — F408 Other phobic anxiety disorders: Secondary | ICD-10-CM | POA: Diagnosis not present

## 2018-05-18 ENCOUNTER — Ambulatory Visit (INDEPENDENT_AMBULATORY_CARE_PROVIDER_SITE_OTHER): Payer: 59 | Admitting: Psychology

## 2018-05-18 DIAGNOSIS — F408 Other phobic anxiety disorders: Secondary | ICD-10-CM

## 2018-06-01 ENCOUNTER — Ambulatory Visit: Payer: 59 | Admitting: Psychology

## 2018-06-01 ENCOUNTER — Ambulatory Visit (HOSPITAL_COMMUNITY): Payer: Self-pay | Admitting: Psychiatry

## 2018-06-08 ENCOUNTER — Ambulatory Visit (HOSPITAL_COMMUNITY): Payer: 59 | Admitting: Psychiatry

## 2018-06-16 ENCOUNTER — Ambulatory Visit (INDEPENDENT_AMBULATORY_CARE_PROVIDER_SITE_OTHER): Payer: 59 | Admitting: Psychiatry

## 2018-06-16 ENCOUNTER — Encounter (HOSPITAL_COMMUNITY): Payer: Self-pay | Admitting: Psychiatry

## 2018-06-16 VITALS — BP 106/68 | HR 89 | Ht 66.5 in | Wt 131.0 lb

## 2018-06-16 DIAGNOSIS — F411 Generalized anxiety disorder: Secondary | ICD-10-CM

## 2018-06-16 DIAGNOSIS — F4010 Social phobia, unspecified: Secondary | ICD-10-CM | POA: Diagnosis not present

## 2018-06-16 MED ORDER — ESCITALOPRAM OXALATE 20 MG PO TABS: 20.0000 mg | ORAL_TABLET | Freq: Every day | ORAL | 0 refills | Status: DC

## 2018-06-16 MED ORDER — BUSPIRONE HCL 5 MG PO TABS: 5.0000 mg | ORAL_TABLET | Freq: Two times a day (BID) | ORAL | 1 refills | Status: DC

## 2018-06-16 NOTE — Progress Notes (Signed)
BH MD/PA/NP OP Progress Note  06/16/2018 12:46 PM [facility]  MRN:  [id number]  Chief Complaint: I still feel nervous and anxious.  It helps my panic attack but I remain very anxious in public places.  HPI: Patient came for his follow-up appointment.  He has been taking Lexapro 20 mg daily.  Though he is seen improvement in overall anxiety but he continues to feel nervous and feeling overwhelmed when he surrounded by strangers and public places.  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  He is hoping to get appointed there.  Patient graduated last May.  He is taking Lexapro 20 mg daily.  He is single and currently not in any relationship.  He has low self-esteem and [place].  He have lack of confidence.  He was seeing Dr. Cheryln Manly but has not seen recently.  His energy level is fair.  He has no tremors, shakes or any EPS.  He has episodes of panic attacks which she described feeling losing control, paresthesia, racing thoughts, shortness of breath and palpitation.  He used to take Inderal which helped the symptoms but has not taken in recent days.  Patient denies drinking or using any illegal substances.  His sleep is good.  His appetite is okay.  He lives with his mother and his stepfather.  Visit Diagnosis:    ICD-10-CM   1. GAD (generalized anxiety disorder) F41.1 busPIRone (BUSPAR) 5 MG tablet  2. 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 Patient denies any history of psychiatric inpatient treatment or any suicidal attempt.  He is seeing Dr. Cheryln Manly for past 2 years for CBT.  Patient denies any history of psychosis, mania, hallucination, agitation, OCD or any PTSD.  Patient denies any history of violent behavior.  He reported history of seizure as a care in [place] but do not remember the details.   Since moved to [place] at age 80 he has no seizures.  Past Medical History:  Past Medical History:  Diagnosis Date  . Allergic rhinitis due to pollen     Past Surgical History:  Procedure Laterality Date  . APPENDECTOMY  03/31/2016   Dr. Dahlia Byes  . LAPAROSCOPIC APPENDECTOMY N/A 03/31/2016   Procedure: APPENDECTOMY LAPAROSCOPIC;  Surgeon: Jules Husbands, MD;  Location: [facility] ORS;  Service: General;  Laterality: N/A;    Family Psychiatric History:.  Family History:  Family History  Problem Relation Age of Onset  . Alzheimer's disease Maternal Grandfather   . Heart disease Neg Hx   . Diabetes Neg Hx     Social History:  Social History   Socioeconomic History  . Marital status: Single    Spouse name: Not on file  . Number of children: 0  . Years of education: Not on file  . Highest education level: Not on file  Occupational History  . Not on file  Social Needs  . Financial resource strain: Not on file  . Food insecurity:    Worry: Not on file    Inability: Not on file  . Transportation needs:    Medical: Not on file    Non-medical: Not on file  Tobacco Use  . Smoking status: Never Smoker  . Smokeless tobacco: Never Used  Substance and Sexual Activity  . Alcohol use: No  .  Drug use: No  . Sexual activity: Not Currently  Lifestyle  . Physical activity:    Days per week: Not on file    Minutes per session: Not on file  . Stress: Not on file  Relationships  . Social connections:    Talks on phone: Not on file    Gets together: Not on file    Attends religious service: Not on file    Active member of club or organization: Not on file    Attends meetings of clubs or organizations: Not on file    Relationship status: Not on file  Other Topics Concern  . Not on file  Social History Narrative   Attended [employer] for [employer]. Now going to [employer] for [employer]. Lives in [place] with his parents. He has 1/2 brother in [place]. Pt used to work with  is [employer] but now is only going to school.     Allergies:  Allergies  Allergen Reactions  . Food     Tested via allergist, allergic peanuts...given Epi pen 04/23/11  . Pollen Extract     Intradermal testing, 6/78/93    Metabolic Disorder Labs: Lab Results  Component Value Date   HGBA1C 5.6 03/01/2018   No results found for: PROLACTIN No results found for: CHOL, TRIG, HDL, CHOLHDL, VLDL, LDLCALC Lab Results  Component Value Date   TSH 1.560 03/01/2018    Therapeutic Level Labs: No results found for this or any previous visit (from the past 2160 hour(s)). No results found for: LITHIUM No results found for: VALPROATE No components found for:  CBMZ  Current Medications: Current Outpatient Medications  Medication Sig Dispense Refill  . caffeine 200 MG TABS tablet Take 200 mg by mouth every 6 (six) hours as needed.    Marland Kitchen escitalopram (LEXAPRO) 20 MG tablet Take 1 tablet (20 mg total) by mouth daily. 90 tablet 0  . propranolol (INDERAL) 10 MG tablet Take 1 tablet (10 mg total) by mouth daily as needed. 30 tablet 0   No current facility-administered medications for this visit.      Musculoskeletal: Strength & Muscle Tone: within normal limits Gait & Station: normal Patient leans: N/A  Psychiatric Specialty Exam: Review of Systems  Constitutional: Negative.   HENT: Negative.   Eyes: Negative.   Respiratory: Negative.   Cardiovascular: Negative.   Genitourinary: Negative.   Musculoskeletal: Negative.   Skin: Negative.   Neurological: Negative.   Psychiatric/Behavioral: The patient is nervous/anxious.     Blood pressure 106/68, pulse 89, height 5' 6.5" (1.689 m), weight 131 lb (59.4 kg), SpO2 98 %.Body mass index is 20.83 kg/m.  General Appearance: Casual  Eye Contact:  Fair  Speech:  Slow  Volume:  Decreased  Mood:  Anxious  Affect:  Congruent and Constricted  Thought Process:  Goal Directed  Orientation:  Full (Time, Place, and Person)  Thought  Content: Rumination   Suicidal Thoughts:  No  Homicidal Thoughts:  No  Memory:  Immediate;   Good Recent;   Good Remote;   Good  Judgement:  Good  Insight:  Good  Psychomotor Activity:  Normal  Concentration:  Concentration: Fair and Attention Span: Fair  Recall:  Good  Fund of Knowledge: Good  Language: Good  Akathisia:  No  Handed:  Right  AIMS (if indicated): not done  Assets:  Communication Skills Desire for Improvement Housing Resilience Social Support  ADL's:  Intact  Cognition: WNL  Sleep:  Fair   Screenings:  Assessment and Plan: Social anxiety disorder.  Generalized anxiety disorder.  Reassurance given.  Recommended to see Dr. Cheryln Manly for social anxiety and coping skills.  Patient has not taken Inderal in a while but like to take something along with Lexapro to help his anxiety.  I will start low-dose BuSpar 5 mg twice a day.  Discussed medication side effects and benefits.  Continue Lexapro 20 mg daily.  I also reviewed his blood work.  He has a normal hemoglobin A1c, CBC, CMP and TSH.  Patient has no tremors, shakes or any EPS.  Encourage healthy lifestyle and watch his calorie intake.  Patient has a lot of questions about long-term prognosis which were answered.  I will see him again in 6 weeks.  Time spent 25 minutes.  More than 50% of the time spent in psychoeducation, counseling, coordination and discussing long-term prognosis.   Kathlee Nations, MD 06/16/2018, 12:46 PM

## 2018-06-17 ENCOUNTER — Other Ambulatory Visit (HOSPITAL_COMMUNITY): Payer: Self-pay | Admitting: Psychiatry

## 2018-06-17 DIAGNOSIS — F4010 Social phobia, unspecified: Secondary | ICD-10-CM

## 2018-06-24 ENCOUNTER — Other Ambulatory Visit (HOSPITAL_COMMUNITY): Payer: Self-pay | Admitting: Psychiatry

## 2018-06-24 NOTE — Telephone Encounter (Signed)
He is no longer taking propanolol.  We started him on BuSpar.

## 2018-06-24 NOTE — Telephone Encounter (Signed)
This is Arfeen's pt

## 2018-07-30 ENCOUNTER — Ambulatory Visit (HOSPITAL_COMMUNITY): Payer: 59 | Admitting: Psychiatry

## 2018-08-03 ENCOUNTER — Other Ambulatory Visit (HOSPITAL_COMMUNITY): Payer: Self-pay | Admitting: Psychiatry

## 2018-08-03 DIAGNOSIS — F411 Generalized anxiety disorder: Secondary | ICD-10-CM

## 2018-08-03 DIAGNOSIS — F4010 Social phobia, unspecified: Secondary | ICD-10-CM

## 2018-09-07 ENCOUNTER — Ambulatory Visit (HOSPITAL_COMMUNITY): Payer: Self-pay | Admitting: Psychiatry

## 2018-09-10 ENCOUNTER — Ambulatory Visit (HOSPITAL_COMMUNITY): Payer: Self-pay | Admitting: Psychiatry

## 2018-12-15 ENCOUNTER — Ambulatory Visit (INDEPENDENT_AMBULATORY_CARE_PROVIDER_SITE_OTHER): Payer: 59 | Admitting: Psychology

## 2018-12-15 DIAGNOSIS — F408 Other phobic anxiety disorders: Secondary | ICD-10-CM

## 2018-12-20 ENCOUNTER — Ambulatory Visit (INDEPENDENT_AMBULATORY_CARE_PROVIDER_SITE_OTHER): Payer: 59 | Admitting: Psychology

## 2018-12-20 DIAGNOSIS — F408 Other phobic anxiety disorders: Secondary | ICD-10-CM

## 2019-01-13 ENCOUNTER — Ambulatory Visit (INDEPENDENT_AMBULATORY_CARE_PROVIDER_SITE_OTHER): Payer: 59 | Admitting: Psychiatry

## 2019-01-13 ENCOUNTER — Encounter

## 2019-01-13 ENCOUNTER — Encounter (HOSPITAL_COMMUNITY): Payer: Self-pay | Admitting: Psychiatry

## 2019-01-13 VITALS — BP 121/80 | HR 74 | Ht 66.5 in | Wt 167.0 lb

## 2019-01-13 DIAGNOSIS — F4010 Social phobia, unspecified: Secondary | ICD-10-CM

## 2019-01-13 DIAGNOSIS — F411 Generalized anxiety disorder: Secondary | ICD-10-CM

## 2019-01-13 MED ORDER — PROPRANOLOL HCL 10 MG PO TABS: 10.0000 mg | ORAL_TABLET | Freq: Every day | ORAL | 1 refills | Status: DC | PRN

## 2019-01-13 MED ORDER — VENLAFAXINE HCL ER 37.5 MG PO CP24: ORAL_CAPSULE | ORAL | 2 refills | Status: DC

## 2019-01-13 NOTE — Progress Notes (Addendum)
BH MD/PA/NP OP Progress Note  01/13/2019 8:28 AM Charles Hayden  MRN:  [id number]  Chief Complaint:  I am no longer taking Lexapro and BuSpar.  I missed appointment and ran out from the medication.  HPI: Charles Hayden 23 year old male who came for his follow-up appointment.  He was last seen in June.  He admitted noncompliant with medication because he missed appointment and ran out from the medication.  In the beginning he fell withdrawal symptoms but now he does not have any withdrawal symptoms but continues to express anxiety symptoms.  Especially when he is in meeting or presentation.  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  He still feel very nervous and anxious around public places.  He remember himself on a Christmas party that he was very isolated, withdrawn and afraid to talk to someone or start conversation.  Denies any panic attack but feel that people judging him when talking about him.  He resume therapy with Dr. Cheryln Manly once a month.  He also endorsed taking caffeine tablets to give him energy because sometimes he feels very tired during the day.  He denies any suicidal thoughts or homicidal thought.  His sleep is okay.  He admitted more than 30 pound weight gain since June.  He admitted not watching his calorie intake, exercise because he does not want to go to gym as he feels very anxious with strangers.  He is single and he has no children.  Denies drinking or using any illegal substances.  He lives with his mother and his stepfather.  Visit Diagnosis:    ICD-10-CM   1. Social anxiety disorder F40.10 venlafaxine XR (EFFEXOR XR) 37.5 MG 24 hr capsule    propranolol (INDERAL) 10 MG tablet  2. GAD (generalized anxiety disorder) F41.1 venlafaxine XR (EFFEXOR XR) 37.5 MG 24 hr capsule    propranolol (INDERAL) 10 MG tablet    Past Psychiatric History: Reviewed. History of social anxiety and possible autistic spectrum disorder.  No history of psychiatric inpatient treatment or  any suicidal attempt.  Saw Dr. Cheryln Manly for CBT.  Try propanolol, BuSpar and Lexapro but stopped on his own.  No history of psychosis, mania, hallucination, agitation, OCD or any PTSD symptoms.  History of seizures while living in [place].  Past Medical History:  Past Medical History:  Diagnosis Date  . Allergic rhinitis due to pollen     Past Surgical History:  Procedure Laterality Date  . APPENDECTOMY  03/31/2016   Dr. Dahlia Byes  . LAPAROSCOPIC APPENDECTOMY N/A 03/31/2016   Procedure: APPENDECTOMY LAPAROSCOPIC;  Surgeon: Jules Husbands, MD;  Location: [facility] ORS;  Service: General;  Laterality: N/A;    Family Psychiatric History: Viewed.  Family History:  Family History  Problem Relation Age of Onset  . Alzheimer's disease Maternal Grandfather   . Heart disease Neg Hx   . Diabetes Neg Hx     Social History:  Social History   Socioeconomic History  . Marital status: Single    Spouse name: Not on file  . Number of children: 0  . Years of education: Not on file  . Highest education level: Not on file  Occupational History  . Not on file  Social Needs  . Financial resource strain: Not on file  . Food insecurity:    Worry: Not on file    Inability: Not on file  . Transportation needs:    Medical: Not on file    Non-medical: Not on file  Tobacco Use  . Smoking status: Never Smoker  . Smokeless tobacco: Never Used  Substance and Sexual Activity  . Alcohol use: No  . Drug use: No  . Sexual activity: Not Currently  Lifestyle  . Physical activity:    Days per week: Not on file    Minutes per session: Not on file  . Stress: Not on file  Relationships  . Social connections:    Talks on phone: Not on file    Gets together: Not on file    Attends religious service: Not on file    Active member of club or organization: Not on file    Attends meetings of clubs or organizations: Not on file    Relationship status: Not on file  Other Topics Concern  . Not on file  Social History  Narrative   Attended [employer] for [employer]. Now going to [employer] for [employer]. Lives in [place] with his parents. He has 1/2 brother in [place]. Pt used to work with is [employer] but now is only going to school.     Allergies:  Allergies  Allergen Reactions  . Food     Tested via allergist, allergic peanuts...given Epi pen 04/23/11  . Pollen Extract     Intradermal testing, 6/62/94    Metabolic Disorder Labs: Lab Results  Component Value Date   HGBA1C 5.6 03/01/2018   No results found for: PROLACTIN No results found for: CHOL, TRIG, HDL, CHOLHDL, VLDL, LDLCALC Lab Results  Component Value Date   TSH 1.560 03/01/2018    Therapeutic Level Labs: No results found for: LITHIUM No results found for: VALPROATE No components found for:  CBMZ  Current Medications: Current Outpatient Medications  Medication Sig Dispense Refill  . busPIRone (BUSPAR) 5 MG tablet Take 1 tablet (5 mg total) by mouth 2 (two) times daily. 60 tablet 1  . caffeine 200 MG TABS tablet Take 200 mg by mouth every 6 (six) hours as needed.    Marland Kitchen escitalopram (LEXAPRO) 20 MG tablet Take 1 tablet (20 mg total) by mouth daily. 90 tablet 0   No current facility-administered medications for this visit.      Musculoskeletal: Strength & Muscle Tone: within normal limits Gait & Station: normal Patient leans: N/A  Psychiatric Specialty Exam: Review of Systems  Constitutional: Positive for malaise/fatigue.       30 pound weight gain  HENT: Negative.   Respiratory: Negative.   Skin: Negative.   Neurological: Negative.   Psychiatric/Behavioral: The patient is nervous/anxious.     Blood pressure 121/80, pulse 74, height 5' 6.5" (1.689 m), weight 167 lb (75.8 kg), SpO2 96 %.There is no height or weight on file to calculate BMI.  General Appearance: Casual  Eye Contact:  Fair  Speech:  Clear and Coherent  Volume:  Normal  Mood:  Anxious  Affect:  Constricted  Thought Process:  Descriptions  of Associations: Intact  Orientation:  Full (Time, Place, and Person)  Thought Content: Rumination   Suicidal Thoughts:  No  Homicidal Thoughts:  No  Memory:  Immediate;   Fair Recent;   Fair Remote;   Fair  Judgement:  Fair  Insight:  Present  Psychomotor Activity:  Decreased  Concentration:  Concentration: Fair and Attention Span: Fair  Recall:  Good  Fund of Knowledge: Good  Language: Good  Akathisia:  No  Handed:  Right  AIMS (if indicated): not done  Assets:  Desire for Improvement Housing Talents/Skills  ADL's:  Intact  Cognition: WNL  Sleep:  Fair   Screenings:   Assessment and Plan: Social anxiety disorder.  Generalized anxiety disorder.  Rule out autism spectrum disorder.  I reviewed previous notes and medication.  He is no longer taking Lexapro and BuSpar since he ran out from the refills and did not had appointment.  He like to try a different medication since he has not seen a huge improvement with the Lexapro and BuSpar when he was taking it.  However he admitted that he was not taking BuSpar every day.  We discussed to restart Inderal which he remember helped a lot when he was doing presentation and his speech in public.  We will start Inderal 10 mg daily as needed when he has presentation and meetings.  We will start Effexor 37.5 mg daily for 1 week and then 75 mg daily.  Given the history of noncompliance with medication I explained that he should not stop the Effexor cold [place] due to significant withdrawal symptoms.  We also talked about stopping caffeine tablets as it may cause increased anxiety and nervousness.  Patient told that he is taking because sometimes he gets very tired and nap during the day.  I explained that if he continued to have the symptoms then he should consider sleep studies.  I also encouraged to see Dr. Cheryln Manly on a regular basis to help coping skills.  Encourage weight loss with healthy lifestyle and watching his calorie intake.  Encouraged  to use treadmill at home 2-3 times for 20 minutes a week.  Recommended to call us back if he has any question, concern or if he feels worsening of the symptoms.  I will see him again in 6 weeks. Time spent 35 minutes.  More than 50% of the time spent in psychoeducation, counseling and coordination of care.  Discuss safety plan that anytime having active suicidal thoughts or homicidal thoughts then patient need to call 911 or go to the local emergency room.     Kathlee Nations, MD 01/13/2019, 8:28 AM

## 2019-02-24 ENCOUNTER — Encounter (HOSPITAL_COMMUNITY): Payer: Self-pay | Admitting: Psychiatry

## 2019-02-24 ENCOUNTER — Ambulatory Visit (INDEPENDENT_AMBULATORY_CARE_PROVIDER_SITE_OTHER): Payer: 59 | Admitting: Psychiatry

## 2019-02-24 DIAGNOSIS — F4010 Social phobia, unspecified: Secondary | ICD-10-CM

## 2019-02-24 DIAGNOSIS — F411 Generalized anxiety disorder: Secondary | ICD-10-CM

## 2019-02-24 MED ORDER — VENLAFAXINE HCL ER 75 MG PO CP24: 75.0000 mg | ORAL_CAPSULE | Freq: Every day | ORAL | 2 refills | Status: DC

## 2019-02-24 NOTE — Progress Notes (Signed)
BH MD/PA/NP OP Progress Note  02/24/2019 8:24 AM Charles Hayden  MRN:  [id number]  Chief Complaint:  I am doing better.  I stop eating snacks.  HPI: Charles Hayden came for his appointment.  He is taking Effexor 75 mg daily which is helping his anxiety and nervousness.  We also prescribed Inderal however he took few times when he has to do presentation.  He like Effexor better than Lexapro and BuSpar.  He is sleeping good.  He cut down eating junk food and he lost 15 pounds since the last visit.  He has not started therapy with Dr. Cheryln Hayden because he felt he is doing fine.  He admitted sometimes lazy and does not feel going to gym.  He is still has residual anxiety around public places and with stranger but he feels the medicine is helping and he is able to function.  He is sleeping good.  He denies any paranoia, hallucination, suicidal thoughts.  He lives with his mother and his stepfather.  He is single and he has no children.  Currently he is not in any relationship.  He denies using any illegal substances.  His energy level is good.  He is still takes caffeine tablets to give him energy but he has cut down from the past.  Visit Diagnosis:    ICD-10-CM   1. 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 GAD (generalized anxiety disorder) F41.1 venlafaxine XR (EFFEXOR-XR) 75 MG 24 hr capsule    Past Psychiatric History: Reviewed. H/O social anxiety and possible autistic spectrum disorder.  No h/o inpatient treatment, suicidal attempt, mania and psychosis. Saw Dr. Cheryln Hayden for CBT.  Try propanolol, BuSpar and Lexapro but stopped on his own.  H/O seizures while living in [place].  Past Medical History:  Past Medical History:  Diagnosis Date  . Allergic rhinitis due to pollen     Past Surgical History:  Procedure Laterality Date  . APPENDECTOMY  03/31/2016   Dr. Dahlia Hayden  . LAPAROSCOPIC APPENDECTOMY N/A 03/31/2016   Procedure: APPENDECTOMY LAPAROSCOPIC;  Surgeon: Charles Husbands, MD;  Location: [facility] ORS;  Service: General;  Laterality: N/A;    Family Psychiatric History: Viewed.  Family History:  Family History  Problem Relation Age of Onset  . Alzheimer's disease Maternal Grandfather   . Heart disease Neg Hx   . Diabetes Neg Hx     Social History:  Social History   Socioeconomic History  . Marital status: Single    Spouse name: Not on file  . Number of children: 0  . Years of education: Not on file  . Highest education level: Not on file  Occupational History  . Not on file  Social Needs  . Financial resource strain: Not on file  . Food insecurity:    Worry: Not on file    Inability: Not on file  . Transportation needs:    Medical: Not on file    Non-medical: Not on file  Tobacco Use  . Smoking status: Never Smoker  . Smokeless tobacco: Never Used  Substance and Sexual Activity  . Alcohol use: No  . Drug use: No  . Sexual activity: Not Currently  Lifestyle  . Physical activity:    Days per week: Not on file    Minutes per session: Not on file  . Stress: Not on file  Relationships  . Social connections:    Talks on phone: Not on file    Gets together: Not  on file    Attends religious service: Not on file    Active member of club or organization: Not on file    Attends meetings of clubs or organizations: Not on file    Relationship status: Not on file  Other Topics Concern  . Not on file  Social History Narrative   Attended [employer] for [employer]. Now going to [employer] for [employer]. Lives in [place] with his parents. He has 1/2 brother in [place]. Pt used to work with is [employer] but now is only going to school.     Allergies:  Allergies  Allergen Reactions  . Food     Tested via allergist, allergic peanuts...given Epi pen 04/23/11  . Pollen Extract     Intradermal testing, 05/25/40    Metabolic Disorder Labs: Lab Results  Component Value Date   HGBA1C 5.6 03/01/2018   No results found for:  PROLACTIN No results found for: CHOL, TRIG, HDL, CHOLHDL, VLDL, LDLCALC Lab Results  Component Value Date   TSH 1.560 03/01/2018    Therapeutic Level Labs: No results found for: LITHIUM No results found for: VALPROATE No components found for:  CBMZ  Current Medications: Current Outpatient Medications  Medication Sig Dispense Refill  . caffeine 200 MG TABS tablet Take 200 mg by mouth every 6 (six) hours as needed.    . propranolol (INDERAL) 10 MG tablet Take 1 tablet (10 mg total) by mouth daily as needed (anxiety). 30 tablet 1  . venlafaxine XR (EFFEXOR XR) 37.5 MG 24 hr capsule Take one capsule daily for 1 week and than 2 capsule daily 30 capsule 2   No current facility-administered medications for this visit.      Musculoskeletal: Strength & Muscle Tone: within normal limits Gait & Station: normal Patient leans: N/A  Psychiatric Specialty Exam: ROS  Blood pressure 128/74, height 5' 6.5" (1.689 m), weight 152 lb (68.9 kg).Body mass index is 24.17 kg/m.  General Appearance: Casual and shy  Eye Contact:  Good  Speech:  Clear and Coherent  Volume:  Normal  Mood:  Anxious  Affect:  Congruent  Thought Process:  Descriptions of Associations: Intact  Orientation:  Full (Time, Place, and Person)  Thought Content: Logical   Suicidal Thoughts:  No  Homicidal Thoughts:  No  Memory:  Immediate;   Good Recent;   Good Remote;   Good  Judgement:  Good  Insight:  Good  Psychomotor Activity:  Normal  Concentration:  Concentration: Fair and Attention Span: Fair  Recall:  Good  Fund of Knowledge: Good  Language: Good  Akathisia:  No  Handed:  Right  AIMS (if indicated): not done  Assets:  Communication Skills Desire for Improvement Housing Resilience  ADL's:  Intact  Cognition: WNL  Sleep:  Good   Screenings:   Assessment and Plan: Generalized anxiety disorder, social anxiety disorder.  Rule out autism spectrum disorder.  Patient doing better since started on  Effexor.  He rarely takes Inderal and needed only when he has to do presentation.  He has no tremors, shakes or any EPS.  He admitted being lazy and not schedule appointment with Dr. Cheryln Hayden but he also feels that his anxiety is much better than before.  Discussed healthy lifestyle and use exercise.  Continue Effexor 75 mg daily.  He does not need a new prescription of Adderall since he has not taking it and he still has a refill remaining.  Recommended to call us back if is any question  or any concern.  Follow-up in 3 months.   Kathlee Nations, MD 02/24/2019, 8:24 AM

## 2019-05-25 ENCOUNTER — Other Ambulatory Visit: Payer: Self-pay

## 2019-05-25 ENCOUNTER — Encounter (HOSPITAL_COMMUNITY): Payer: Self-pay | Admitting: Psychiatry

## 2019-05-25 ENCOUNTER — Ambulatory Visit (INDEPENDENT_AMBULATORY_CARE_PROVIDER_SITE_OTHER): Payer: 59 | Admitting: Psychiatry

## 2019-05-25 DIAGNOSIS — F411 Generalized anxiety disorder: Secondary | ICD-10-CM | POA: Diagnosis not present

## 2019-05-25 DIAGNOSIS — F4010 Social phobia, unspecified: Secondary | ICD-10-CM

## 2019-05-25 MED ORDER — VENLAFAXINE HCL ER 75 MG PO CP24: 75.0000 mg | ORAL_CAPSULE | Freq: Every day | ORAL | 2 refills | Status: DC

## 2019-05-25 NOTE — Progress Notes (Signed)
Virtual Visit via Telephone Note  I connected with Charles Hayden on 05/25/19 at  4:00 PM EDT by telephone and verified that I am speaking with the correct person using two identifiers.   I discussed the limitations, risks, security and privacy concerns of performing an evaluation and management service by telephone and the availability of in person appointments. I also discussed with the patient that there may be a patient responsible charge related to this service. The patient expressed understanding and agreed to proceed.   History of Present Illness: Patient was evaluated on phone session.  His anxiety is under control but he is experiencing that sometimes he gets sleepy during the day and get tired.  He admitted frequent nap while he is on the computer.  He is taking caffeine tablets but sometimes he feel they are not working as good.  He started taking caffeine tablets when he was in college and remember falling asleep while driving.  He never had any sleep study.  Overall his anxiety is much better.  He is no longer taking Inderal.  He denies any crying spells, feeling of hopelessness or worthlessness.  Due to COVID-19 he does not go outside and did not have any anxiety in public places.  He admitted not started therapy with Dr. Cheryln Manly because he feels doing well.  He denies any hallucination, paranoia or any suicidal thoughts.  He lives with his mother and stepfather.  He is single and currently he is not in relationship.  Denies drinking or using any illegal substances.  Energy level is fair.   Past Psychiatric History: Reviewed. H/O social anxiety and possible autistic spectrum disorder. No h/o inpatient treatment, suicidal attempt, mania and psychosis. Saw Dr. Cheryln Manly for CBT. Try propanolol, BuSpar and Lexapro but stopped on his own. H/O seizures while living in [place].   Psychiatric Specialty Exam: Physical Exam  ROS  There were no vitals taken for this visit.There is no height or  weight on file to calculate BMI.  General Appearance: NA  Eye Contact:  NA  Speech:  Slow  Volume:  Normal  Mood:  Anxious  Affect:  NA  Thought Process:  Descriptions of Associations: Intact  Orientation:  Full (Time, Place, and Person)  Thought Content:  Logical  Suicidal Thoughts:  No  Homicidal Thoughts:  No  Memory:  Immediate;   Good Recent;   Good Remote;   Good  Judgement:  Good  Insight:  Fair  Psychomotor Activity:  NA  Concentration:  Concentration: Fair and Attention Span: Fair  Recall:  Good  Fund of Knowledge:  Good  Language:  Good  Akathisia:  NA  Handed:  Right  AIMS (if indicated):     Assets:  Communication Skills Desire for Improvement Housing Resilience  ADL's:  Intact  Cognition:  WNL  Sleep:         Assessment and Plan: Generalized anxiety disorder, social anxiety disorder.  Rule out narcolepsy.  Patient is doing better on Effexor and his anxiety is under control.  He is no longer taking Inderal.  We discussed his frequent napping during the day.  He is using caffeine tablets since college.  He has no sleep studies in the past.  I recommend that he should see neurology to rule out narcolepsy and have sleep study.  Discussed medication side effects and benefits.  He like to continue Effexor which is helping his anxiety.  Discussed healthy lifestyle and watch his calorie intake and do regular exercise.  Recommended  to call us back if is any question or any concern.  Follow-up in 3 months.  Follow Up Instructions:    I discussed the assessment and treatment plan with the patient. The patient was provided an opportunity to ask questions and all were answered. The patient agreed with the plan and demonstrated an understanding of the instructions.   The patient was advised to call back or seek an in-person evaluation if the symptoms worsen or if the condition fails to improve as anticipated.  I provided 20 minutes of non-face-to-face time during this  encounter.   Kathlee Nations, MD

## 2019-06-20 ENCOUNTER — Ambulatory Visit (INDEPENDENT_AMBULATORY_CARE_PROVIDER_SITE_OTHER): Payer: 59 | Admitting: Neurology

## 2019-06-20 ENCOUNTER — Other Ambulatory Visit: Payer: Self-pay

## 2019-06-20 ENCOUNTER — Encounter: Payer: Self-pay | Admitting: Neurology

## 2019-06-20 VITALS — BP 110/70 | HR 72 | Temp 97.8°F | Ht 67.0 in | Wt 154.0 lb

## 2019-06-20 DIAGNOSIS — R5382 Chronic fatigue, unspecified: Secondary | ICD-10-CM

## 2019-06-20 DIAGNOSIS — J301 Allergic rhinitis due to pollen: Secondary | ICD-10-CM | POA: Diagnosis not present

## 2019-06-20 DIAGNOSIS — R0683 Snoring: Secondary | ICD-10-CM

## 2019-06-20 DIAGNOSIS — G4710 Hypersomnia, unspecified: Secondary | ICD-10-CM

## 2019-06-20 DIAGNOSIS — F39 Unspecified mood [affective] disorder: Secondary | ICD-10-CM

## 2019-06-20 NOTE — Patient Instructions (Signed)

## 2019-06-20 NOTE — Progress Notes (Signed)
SLEEP MEDICINE CLINIC    Provider:  Larey Seat, MD  Primary Care Physician:  Venia Carbon, MD [place] [address]     Referring Provider: Dr Adele Schilder        Chief Complaint according to patient   Patient presents with:    . New Patient (Initial Visit)           HISTORY OF PRESENT ILLNESS:  Charles Hayden is a 23 y.o. year old Asian male patient seen here upon referral on 06/20/2019 from Dr. Silvio Pate Dr. Adele Schilder for a sleep evalutaion.  Chief concern according to patient :  Excessive daytime sleepiness   I have the pleasure of seeing Charles Hayden today, a 23-handed Asian male with a possible sleep disorder, who has a  has a past medical history of Allergic rhinitis due to pollen and Anxiety..     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 During college he had trouble to get up in AM, was sleepy in class, falling asleep easily and fighting drowsiness when commuting home   Nocturia/ Enuresis None ,  Sleep walking: none , Night terrors none  Tonsillectomy none ,      Family medical /sleep history: unaware of other family members on CPAP with OSA, insomnia, sleep walkers.  MGF dementia.    Social history:  Patient is working as a Programme researcher, broadcasting/film/video and lives in a household with 3 persons- with his parents. . Family status is single.  The patient currently works regular hours. Pets are not present. Tobacco use: none .  ETOH use; none ,  Caffeine intake in form of Caffeine tablets. Regular exercise: none.   Hobbies :none.    Sleep habits are as follows: The patient's dinner time is between 11- 12 PM.  The patient goes to bed at 2 AM and falls asleep easily- he continues to sleep for 6-8 hours, wakes rarely for bathroom breaks.    The preferred sleep position is side , with the support of 1 pillows. Dreams are reportedly frequent/vivid/ nightmarish.    9 AM is the usual rise  time.  The patient wakes up after multiple alarms.  He reports not feeling refreshed or restored in AM, or rarely so.  He reports neither dry mouth,  morning headaches but residual fatigue.  Naps are taken unscheduled but frequently, lasting from  2-3 hours and are more refreshing than nocturnal sleep.    Review of Systems: Out of a complete 14 system review, the patient complains of only the following symptoms, and all other reviewed systems are negative.:  Fatigue, sleepiness , snoring, fragmented sleep, hypersomnia    How likely are you to doze in the following situations: 0 = not likely, 1 = slight chance, 2 = moderate chance, 3 = high chance   Sitting and Reading?  Watching Television? Sitting inactive in a public place (theater or meeting)? As a passenger in a car for an hour without a break? Lying down in the afternoon when circumstances permit? Sitting and talking to someone? Sitting quietly after lunch without alcohol? In a car, while stopped for a few minutes in traffic?   Total = 16/ 24 points   FSS endorsed at 43/ 63 points.   Social History   Socioeconomic History  . Marital status: Single    Spouse name: Not on file  . Number of children: 0  . Years of  education: Not on file  . Highest education level: Not on file  Occupational History  . Not on file  Social Needs  . Financial resource strain: Not on file  . Food insecurity    Worry: Not on file    Inability: Not on file  . Transportation needs    Medical: Not on file    Non-medical: Not on file  Tobacco Use  . Smoking status: Never Smoker  . Smokeless tobacco: Never Used  Substance and Sexual Activity  . Alcohol use: No  . Drug use: No  . Sexual activity: Not Currently  Lifestyle  . Physical activity    Days per week: Not on file    Minutes per session: Not on file  . Stress: Not on file  Relationships  . Social Herbalist on phone: Not on file    Gets together: Not on file     Attends religious service: Not on file    Active member of club or organization: Not on file    Attends meetings of clubs or organizations: Not on file    Relationship status: Not on file  Other Topics Concern  . Not on file  Social History Narrative   Attended [employer] for [employer]. Now going to [employer] for [employer]. Lives in [place] with his parents. He has 1/2 brother in [place]. Pt used to work with is [employer] but now is only going to school.     Family History  Problem Relation Age of Onset  . Alzheimer's disease Maternal Grandfather   . Heart disease Neg Hx   . Diabetes Neg Hx     Past Medical History:  Diagnosis Date  . Allergic rhinitis due to pollen   . Anxiety     Past Surgical History:  Procedure Laterality Date  . APPENDECTOMY  03/31/2016   Dr. Dahlia Byes  . LAPAROSCOPIC APPENDECTOMY N/A 03/31/2016   Procedure: APPENDECTOMY LAPAROSCOPIC;  Surgeon: Jules Husbands, MD;  Location: [facility] ORS;  Service: General;  Laterality: N/A;     Current Outpatient Medications on File Prior to Visit  Medication Sig Dispense Refill  . caffeine 200 MG TABS tablet Take 200 mg by mouth every 6 (six) hours as needed.    . propranolol (INDERAL) 10 MG tablet Take 1 tablet (10 mg total) by mouth daily as needed (anxiety). 30 tablet 1  . venlafaxine XR (EFFEXOR-XR) 75 MG 24 hr capsule Take 1 capsule (75 mg total) by mouth daily with breakfast. 30 capsule 2   No current facility-administered medications on file prior to visit.     Allergies  Allergen Reactions  . Food     Tested via allergist, allergic peanuts...given Epi pen 04/23/11  . Pollen Extract     Intradermal testing, 04/23/11    Physical exam:  Today's Vitals   06/20/19 0842  BP: 110/70  Pulse: 72  Temp: 97.8 F (36.6 C)  Weight: 154 lb (69.9 kg)  Height: 5\' 7"  (1.702 m)   Body mass index is 24.12 kg/m.   Wt Readings from Last 3 Encounters:  06/20/19 154 lb (69.9 kg)  07/10/17 129 lb (58.5 kg)   04/17/16 125 lb (56.7 kg)     Ht Readings from Last 3 Encounters:  06/20/19 5\' 7"  (1.702 m)  04/17/16 5\' 6"  (1.676 m)  04/15/16 5' 6.75" (1.695 m)      General: The patient is awake, alert and appears not in acute distress. The patient is well  groomed. Head: Normocephalic, atraumatic. Neck is supple. Mallampati 1,  neck circumference: 15. 25  inches . Nasal airflow is patent.  Retrognathia is not seen.  Dental status:  Cardiovascular:  Regular rate and cardiac rhythm by pulse,  without distended neck veins. Respiratory: Lungs are clear to auscultation.  Skin:  Without evidence of ankle edema, or rash. Acne .  Trunk: The patient's posture is erect.   Neurologic exam : The patient is awake and alert, oriented to place and time.   Memory subjective described as intact.  Attention span & concentration ability appears normal.  Speech is fluent,  without  dysarthria, dysphonia or aphasia.  Mood and affect are appropriate.   Cranial nerves: no loss of smell or taste reported  Pupils are equal and briskly reactive to light. Funduscopic exam deferred.  Extraocular movements in vertical and horizontal planes were intact and without nystagmus.  No Diplopia. Visual fields by finger perimetry are intact. Hearing was intact to soft voice and finger rubbing.    Facial sensation intact to fine touch.  Facial motor strength is symmetric and tongue and uvula move midline.  Neck ROM : rotation, tilt and flexion extension were normal for age and shoulder shrug was symmetrical.    Motor exam:  Symmetric bulk, tone and ROM.   Normal tone without cog wheeling, symmetric grip strength .   Sensory:  Fine touch, pinprick and vibration were tested  and  normal.  Proprioception tested in the upper extremities was normal.   Coordination: Rapid alternating movements in the fingers/hands were of normal speed.  The Finger-to-nose maneuver was intact without evidence of ataxia, dysmetria or tremor.    Gait and station: Patient could rise unassisted from a seated position, walked without assistive device.  Stance is of normal width/ base and the patient turned with 3 steps.  Toe and heel walk were deferred.  Deep tendon reflexes: in the  upper and lower extremities are symmetric and intact.  Babinski response was deferred normal.        After spending a total time of  25 minutes face to face and additional time for physical and neurologic examination, review of laboratory studies,  personal review of imaging studies, reports and results of other testing and review of referral information / records as far as provided in visit, I have established the following assessments:  1) hypersomnia with night mares. No sleep paralysis, no dream intrusion, no cataplexy.  2) high fatigue, anxiety 3) caffeine use/ abuse/ overuse.    My Plan is to proceed with:  1) attended PS[facility] for sleep architecture review. He will stay on effexor, and was asked to reduce gradually his caffeine intake.  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    I would like to thank Venia Carbon, MD and Venia Carbon, [address] [place],  [place] [address] for allowing me to meet with and to take care of this pleasant patient.   In short, Charles Hayden is presenting with hypersomia with high fatigue and prolonged sleep time-   symptoms that can be attributed to depression, and is alos seen in autoimmune disorders    I plan to follow up either personally or through our NP within 2 month if any organic sleep disorderis identified. .   CC: I will share my notes with PCP and Dr Adele Schilder.   Electronically signed by: Larey Seat, MD 06/20/2019 9:09 AM  [facility] [place]  Sleep Board certified by The [employer] of Sleep Medicine and Diplomate of the [place] of Sleep Medicine. Board certified In Neurology through the [place], Fellow of the  [employer] of Neurology. Medical Director of [employer].

## 2019-07-26 ENCOUNTER — Ambulatory Visit (INDEPENDENT_AMBULATORY_CARE_PROVIDER_SITE_OTHER): Payer: 59 | Admitting: Neurology

## 2019-07-26 DIAGNOSIS — F39 Unspecified mood [affective] disorder: Secondary | ICD-10-CM

## 2019-07-26 DIAGNOSIS — G4710 Hypersomnia, unspecified: Secondary | ICD-10-CM

## 2019-07-26 DIAGNOSIS — G4733 Obstructive sleep apnea (adult) (pediatric): Secondary | ICD-10-CM

## 2019-07-26 DIAGNOSIS — R0683 Snoring: Secondary | ICD-10-CM

## 2019-07-26 DIAGNOSIS — R5382 Chronic fatigue, unspecified: Secondary | ICD-10-CM

## 2019-07-26 DIAGNOSIS — J301 Allergic rhinitis due to pollen: Secondary | ICD-10-CM | POA: Diagnosis not present

## 2019-07-29 NOTE — Procedures (Signed)
PATIENT'S NAME:  Charles Hayden, Charles Hayden DOB:      12/18/1996      MR#:    [id number]     DATE OF RECORDING: 07/26/2019 [facility] REFERRING M.D.:  Charles Simpler, MD Study Performed:   Expanded EEG Polysomnogram HISTORY:  Charles Hayden is a 23 y.o. year old Asian male patient seen on 06/20/2019, upon referral by Dr. Silvio Hayden Dr. Adele Hayden for a sleep evaluation.  Chief concern according to patient:  Excessive daytime sleepiness I have the pleasure of seeing Charles Hayden today, a 23-handed Asian male with a possible sleep disorder, who has a  has a past medical history of Allergic rhinitis due to pollen and of Anxiety. 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 During college he had trouble to get up in AM, was sleepy in class, was falling asleep easily and constantly fighting drowsiness when commuting home.     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 Family status is single. He endorsed caffeine intake in form of caffeine tablets. Sleep habits are as follows: The patient's dinner time is between 11- 12 PM.  The patient goes to bed at 2 AM and falls asleep easily- he continues to sleep for 6-8 hours, wakes rarely for bathroom breaks. 9 AM is the usual rise time but he only wakes up after multiple alarms. Dreams are reportedly frequent and vivid/ nightmarish in character.  Naps are taken unscheduled but frequently, lasting from 2-3 hours and are more refreshing than nocturnal sleep.   The patient endorsed the Epworth Sleepiness Scale at 16/24 points.   The patient's weight 154 pounds with a height of 67 (inches), resulting in a BMI of 24.2 kg/m2. The patient's neck circumference measured 15.2 inches.  CURRENT MEDICATIONS: Caffeine tabs, Inderal, Effexor.   PROCEDURE:  This is a multichannel digital polysomnogram utilizing the Somnostar 11.2 system.  Electrodes and sensors were applied and monitored per AASM  Specifications.   EEG, EOG, Chin and Limb EMG, were sampled at 200 Hz.  ECG, Snore and Nasal Pressure, Thermal Airflow, Respiratory Effort, CPAP Flow and Pressure, Oximetry was sampled at 50 Hz. Digital video and audio were recorded.      BASELINE STUDY: Lights Out was at 22:36 and Lights On at 05:00.  Total recording time (TRT) was 385 minutes, with a total sleep time (TST) of 329.5 minutes.   The patient's sleep latency was 15.5 minutes.  REM latency was 243 minutes.  The sleep efficiency was 85.6 %.     SLEEP ARCHITECTURE: WASO (Wake after sleep onset) was 39.5 minutes.  There were 1.5 minutes in Stage N1, 131.5 minutes Stage N2, 174 minutes Stage N3 and 22.5 minutes in Stage REM.  The percentage of Stage N1 was 0.5%, Stage N2 was 39.9%, Stage N3 was 52.8% and Stage R (REM sleep) was 6.8%.    RESPIRATORY ANALYSIS:  There were a total of 11 respiratory events:  1 obstructive apnea, 0 central apneas and 0 mixed apneas with 10 hypopneas. The total APNEA/HYPOPNEA INDEX (AHI) was 2.0/hour.  No events occurred in REM sleep and 20 events in NREM. The REM AHI was 0.0 /hour, versus a non-REM AHI of 2.1. The patient spent 286.5 minutes of total sleep time in the supine position and 43 minutes in non-supine. The supine AHI was 2.3/h versus a non-supine AHI of 0.0/h.  OXYGEN SATURATION & C02:  The Wake baseline 02 saturation was 96%,  with the lowest being 90%. Time spent below 89% saturation equaled 0 minutes. The arousals were noted as: 80 were spontaneous, 0 were associated with PLMs, and 3 were associated with respiratory events. The patient had a total of 0 Periodic Limb Movements.   Audio and video analysis did not show any abnormal or unusual movements, behaviors, phonations or vocalizations.  He moved throughout N3 sleep, and may well have dreamt, there was moaning /phonation noted within the last minutes of the sleep study, but he did not act out dreams in REM sleep. REM sleep was reduced by proportional  sleep time, following a late onset.  Snoring was only twice noted. EKG was in keeping with normal sinus rhythm (NSR). Post-study, the patient indicated that sleep was worse in the laboratory than usual.    IMPRESSION: Sleep architecture showed age appropriate slow wave sleep dominance, N3 stage.  1.  Phonation recorded in last period of N3 sleep. Patient reported he had a dream at the end of the study.   RECOMMENDATIONS:  1. Slow wave sleep dream activity is present, but no parasomnia activity was captured.  2. REM sleep is likely medication related suppressed (Effexor). 3. 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 Follow up is optional.     I certify that I have reviewed the entire raw data recording prior to the issuance of this report in accordance with the Standards of Accreditation of the [place] [employer] of Sleep Medicine (AASM)   Larey Seat, MD    07-28-2019 Diplomat, American Board of Psychiatry and Neurology  Diplomat, American Board of Sleep Medicine Medical Director, [employer] [facility] [employer], [employer] of Sleep Medicine

## 2019-08-02 ENCOUNTER — Telehealth: Payer: Self-pay | Admitting: Neurology

## 2019-08-02 NOTE — Telephone Encounter (Signed)
I called the pt and reviewed the sleep study results. Patient verbalized understanding of the SS results and I have scheduled the office visit follow up to discuss further.

## 2019-08-02 NOTE — Telephone Encounter (Signed)
-----   Message from Larey Seat, MD sent at 07/29/2019  4:31 PM EDT -----  IMPRESSION: Sleep architecture showed age appropriate slow wave sleep dominance, N3 stage.  1.  Phonation recorded in last period of N3 sleep. Patient reported he had a dream at the end of the study.   RECOMMENDATIONS:  1. Slow wave sleep dream activity is present, but no parasomnia activity was captured.  2. REM sleep is likely medication related suppressed (Effexor). 3. 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 Follow up is optional.   Larey Seat, MD    07-28-2019

## 2019-08-15 ENCOUNTER — Other Ambulatory Visit: Payer: Self-pay

## 2019-08-15 ENCOUNTER — Ambulatory Visit (INDEPENDENT_AMBULATORY_CARE_PROVIDER_SITE_OTHER): Payer: 59 | Admitting: Neurology

## 2019-08-15 ENCOUNTER — Encounter: Payer: Self-pay | Admitting: Neurology

## 2019-08-15 VITALS — BP 115/78 | HR 81 | Temp 98.5°F | Ht 67.0 in | Wt 159.0 lb

## 2019-08-15 DIAGNOSIS — G4710 Hypersomnia, unspecified: Secondary | ICD-10-CM

## 2019-08-15 DIAGNOSIS — G4719 Other hypersomnia: Secondary | ICD-10-CM | POA: Diagnosis not present

## 2019-08-15 DIAGNOSIS — G4711 Idiopathic hypersomnia with long sleep time: Secondary | ICD-10-CM | POA: Diagnosis not present

## 2019-08-15 DIAGNOSIS — R5382 Chronic fatigue, unspecified: Secondary | ICD-10-CM

## 2019-08-15 DIAGNOSIS — R0683 Snoring: Secondary | ICD-10-CM

## 2019-08-15 NOTE — Patient Instructions (Signed)

## 2019-08-15 NOTE — Progress Notes (Signed)
SLEEP MEDICINE CLINIC    Provider:  Larey Seat, MD  Primary Care Physician:  Venia Carbon, MD [place] [address]     Referring Provider: Dr Adele Schilder        Chief Complaint according to patient   Patient presents with:     New Patient (Initial Visit)           HISTORY OF PRESENT ILLNESS:  Charles Hayden is a 23 y.o. year old Asian male patient seen here upon referral on 08/15/2019 from Dr. Silvio Pate Dr. Adele Schilder for a sleep evalutaion.  Chief concern according to patient :  Excessive daytime sleepiness    Rv 08-15-2019 -I have the pleasure of seeing Charles Hayden today in follow up , a 23-handed Asian male who  has a past medical history of Allergic rhinitis due to pollen and Anxiety..  In short, Charles Hayden is presenting with hypersomia with high fatigue and prolonged sleep time-  symptoms that can be attributed to depression, and is alos seen in autoimmune disorders. His sleep study did not show any apnea, PLMs or even parasomnia. There was no physiological abnormality found, and the sleep architecy ture has been influenced by medication. The diagnosis was idiopathic hypersomnia and would not be followed up here.     PS :I had planned to follow up either personally or through our NP within 2 month if any organic sleep disorders were identified..  This is not the case, he continues to endorse the Epworth score at 14/ 24 points, elevated. Fatigue severity at 47/ 63 points, elevated. He denies chronic discomfort or pain, no joint pain, myalgia or infections. I offered a HLA narcolepsy test today, and if this is negative I would recommend to see counseling for depression.and possible use a stimulant medication.         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 During college he had trouble to get up in AM, was sleepy in class, falling asleep easily and fighting drowsiness when  commuting home   Nocturia/ Enuresis None ,  Sleep walking: none , Night terrors none  Tonsillectomy none ,   Family medical /sleep history: unaware of other family members on CPAP with OSA, insomnia, sleep walkers.  MGF dementia.    Social history:  Patient is working as a Programme researcher, broadcasting/film/video and lives in a household with 3 persons- with his parents. . Family status is single.  The patient currently works regular hours. Pets are not present. Tobacco use: none .  ETOH use; none ,  Caffeine intake in form of Caffeine tablets. Regular exercise: none.   Hobbies :none.    Sleep habits are as follows: The patient's dinner time is between 11- 12 PM.  The patient goes to bed at 2 AM and falls asleep easily- he continues to sleep for 6-8 hours, wakes rarely for bathroom breaks.    The preferred sleep position is side , with the support of 1 pillows. Dreams are reportedly frequent/vivid/ nightmarish.    9 AM is the usual rise time.  The patient wakes up after multiple alarms.  He reports not feeling refreshed or restored in AM, or rarely so.  He reports neither dry mouth,  morning headaches but residual fatigue.  Naps are taken unscheduled but frequently, lasting from  2-3 hours and are more refreshing than nocturnal sleep.    Review of Systems: Out of  a complete 14 system review, the patient complains of only the following symptoms, and all other reviewed systems are negative.:  Fatigue, sleepiness , snoring, fragmented sleep, hypersomnia    How likely are you to doze in the following situations: 0 = not likely, 1 = slight chance, 2 = moderate chance, 3 = high chance   Sitting and Reading?  Watching Television? Sitting inactive in a public place (theater or meeting)? As a passenger in a car for an hour without a break? Lying down in the afternoon when circumstances permit? Sitting and talking to someone? Sitting quietly after lunch without alcohol? In a car, while stopped for a few  minutes in traffic?   Total = 16/ 24 points   FSS endorsed at 43/ 63 points.   Social History   Socioeconomic History   Marital status: Single    Spouse name: Not on file   Number of children: 0   Years of education: Not on file   Highest education level: Not on file  Occupational History   Not on file  Social Needs   Financial resource strain: Not on file   Food insecurity    Worry: Not on file    Inability: Not on file   Transportation needs    Medical: Not on file    Non-medical: Not on file  Tobacco Use   Smoking status: Never Smoker   Smokeless tobacco: Never Used  Substance and Sexual Activity   Alcohol use: No   Drug use: No   Sexual activity: Not Currently  Lifestyle   Physical activity    Days per week: Not on file    Minutes per session: Not on file   Stress: Not on file  Relationships   Social connections    Talks on phone: Not on file    Gets together: Not on file    Attends religious service: Not on file    Active member of club or organization: Not on file    Attends meetings of clubs or organizations: Not on file    Relationship status: Not on file  Other Topics Concern   Not on file  Social History Narrative   Attended [employer] for [employer]. Now going to [employer] for [employer]. Lives in [place] with his parents. He has 1/2 brother in [place]. Pt used to work with is [employer] but now is only going to school.     Family History  Problem Relation Age of Onset   Alzheimer's disease Maternal Grandfather    Heart disease Neg Hx    Diabetes Neg Hx     Past Medical History:  Diagnosis Date   Allergic rhinitis due to pollen    Anxiety     Past Surgical History:  Procedure Laterality Date   APPENDECTOMY  03/31/2016   Dr. Dahlia Byes   LAPAROSCOPIC APPENDECTOMY N/A 03/31/2016   Procedure: APPENDECTOMY LAPAROSCOPIC;  Surgeon: Jules Husbands, MD;  Location: [facility] ORS;  Service: General;  Laterality: N/A;      Current Outpatient Medications on File Prior to Visit  Medication Sig Dispense Refill   caffeine 200 MG TABS tablet Take 200 mg by mouth every 6 (six) hours as needed.     propranolol (INDERAL) 10 MG tablet Take 1 tablet (10 mg total) by mouth daily as needed (anxiety). 30 tablet 1   venlafaxine XR (EFFEXOR-XR) 75 MG 24 hr capsule Take 1 capsule (75 mg total) by mouth daily with breakfast. 30 capsule 2  No current facility-administered medications on file prior to visit.     Allergies  Allergen Reactions   Food     Tested via allergist, allergic peanuts...given Epi pen 04/23/11   Pollen Extract     Intradermal testing, 04/23/11    Physical exam:  Today's Vitals   08/15/19 1450  BP: 115/78  Pulse: 81  Temp: 98.5 F (36.9 C)   There is no height or weight on file to calculate BMI.   Wt Readings from Last 3 Encounters:  06/20/19 154 lb (69.9 kg)  07/10/17 129 lb (58.5 kg)  04/17/16 125 lb (56.7 kg)     Ht Readings from Last 3 Encounters:  06/20/19 5' 7" (1.702 m)  04/17/16 5' 6" (1.676 m)  04/15/16 5' 6.75" (1.695 m)      General: The patient is awake, alert and appears not in acute distress. The patient is well groomed. Head: Normocephalic, atraumatic. Neck is supple. Mallampati 1,  neck circumference: 15. 25  inches . Nasal airflow is patent.  Retrognathia is not seen.  Dental status:  Cardiovascular:  Regular rate and cardiac rhythm by pulse,  without distended neck veins. Respiratory: Lungs are clear to auscultation.  Skin:  Without evidence of ankle edema, or rash. Acne .  Trunk: The patient's posture is erect.   Neurologic exam : The patient is awake and alert, oriented to place and time.   Memory subjective described as intact.  Attention span & concentration ability appears normal.  Speech is fluent,  without  dysarthria, dysphonia or aphasia.  Mood and affect are appropriate.   Cranial nerves: no loss of smell or taste reported  Pupils are equal  and briskly reactive to light. Facial motor strength is symmetric and tongue and uvula move midline.  Neck ROM : rotation, tilt and flexion extension were normal for age and shoulder shrug was symmetrical.    Motor exam:  Symmetric bulk, tone and ROM.   Normal tone without cog wheeling, symmetric grip strength .  Deep tendon reflexes: in the  upper and lower extremities are symmetric and intact.  Babinski response was deferred normal.        After spending a total time of 15 minutes face to face and additional time for physical and neurologic examination, review of laboratory studies,  personal review of imaging studies, reports and results of other testing and review of referral information / records as far as provided in visit, I have established the following assessments:  1) hypersomnia with nightmares. No sleep paralysis, no dream intrusion, no cataplexy.  2) high fatigue, anxiety 3) caffeine use/ abuse/ overuse.    My Plan is to proceed with:  ideopathic hypersomnia, referral back to psychiatry and counseling , at [facility]. HLA test to make 90% sure we are not dealing with any narcolepsy associated autoimmune process. .     I would like to thank Venia Carbon, MD  for allowing me to meet with and to take care of this pleasant patient.    CC: I will share my notes with PCP and Dr Adele Schilder. No follow up is needed  if HLA returns negative.     Electronically signed by: Larey Seat, MD 08/15/2019 2:58 PM  [facility] Board certified by The [employer] of Sleep Medicine and Diplomate of the [employer] of Sleep Medicine. Board certified In Neurology through the [place], Fellow of the [employer] of Neurology. Medical Director of [facility].

## 2019-08-23 ENCOUNTER — Encounter (HOSPITAL_COMMUNITY): Payer: Self-pay | Admitting: Psychiatry

## 2019-08-23 ENCOUNTER — Ambulatory Visit (INDEPENDENT_AMBULATORY_CARE_PROVIDER_SITE_OTHER): Payer: 59 | Admitting: Psychiatry

## 2019-08-23 ENCOUNTER — Other Ambulatory Visit: Payer: Self-pay

## 2019-08-23 DIAGNOSIS — F4010 Social phobia, unspecified: Secondary | ICD-10-CM

## 2019-08-23 DIAGNOSIS — F411 Generalized anxiety disorder: Secondary | ICD-10-CM | POA: Diagnosis not present

## 2019-08-23 LAB — NARCOLEPSY EVALUATION
DQA1*01:02: POSITIVE
DQB1*06:02: NEGATIVE

## 2019-08-23 MED ORDER — VENLAFAXINE HCL ER 75 MG PO CP24: 75.0000 mg | ORAL_CAPSULE | Freq: Every day | ORAL | 0 refills | Status: DC

## 2019-08-23 MED ORDER — HYDROXYZINE HCL 10 MG PO TABS: ORAL_TABLET | ORAL | 0 refills | Status: DC

## 2019-08-23 NOTE — Progress Notes (Signed)
Virtual Visit via Telephone Note  I connected with Charles Hayden on 08/23/19 at  4:00 PM EDT by telephone and verified that I am speaking with the correct person using two identifiers.   I discussed the limitations, risks, security and privacy concerns of performing an evaluation and management service by telephone and the availability of in person appointments. I also discussed with the patient that there may be a patient responsible charge related to this service. The patient expressed understanding and agreed to proceed.   History of Present Illness: Patient was evaluated by phone session.  He is taking Effexor which is helping his anxiety but he continues to struggle with hypersomnia some times.  He takes caffeine tablet which helps him keep him awake.  He recently had a sleep study and his neurologist discussed that he may have a hypersomnia and having blood test to rule out the diagnosis.  Overall he feel her anxiety is not as bad.  He is able to go outside without any issues but he is still take some time propanolol.  He has no tremors, shakes or any EPS.  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  He lives with his parents.  He did not resume his therapy from [facility] because he feels he does not need it.  Denies any crying spells, panic attack, mood swing.  He denies drinking or using any illegal substances.  His appetite is okay.  His energy level is good.    Past Psychiatric History:Reviewed. 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 No h/oinpatient treatment,suicidal attempt, mania and psychosis.Saw Dr. Cheryln Manly for CBT. Try propanolol, BuSpar and Lexapro but stopped on his own. H/Oseizures while living in [place].    Psychiatric Specialty Exam: Physical Exam  ROS  There were no vitals taken for this visit.There is no height or weight on file to calculate BMI.  General Appearance: NA  Eye Contact:  NA  Speech:  Clear and Coherent and Slow   Volume:  Decreased  Mood:  Euthymic  Affect:  NA  Thought Process:  Goal Directed  Orientation:  Full (Time, Place, and Person)  Thought Content:  Logical  Suicidal Thoughts:  No  Homicidal Thoughts:  No  Memory:  Immediate;   Good Recent;   Good Remote;   Good  Judgement:  Good  Insight:  Good  Psychomotor Activity:  NA  Concentration:  Concentration: Good and Attention Span: Good  Recall:  Good  Fund of Knowledge:  Good  Language:  Good  Akathisia:  No  Handed:  Right  AIMS (if indicated):     Assets:  Communication Skills Desire for Improvement Housing Resilience Social Support Talents/Skills Transportation  ADL's:  Intact  Cognition:  WNL  Sleep:         Assessment and Plan: Generalized anxiety disorder.  Social anxiety disorder.  Rule out narcolepsy.  Patient recently had sleep study and now he is waiting blood test results to rule out narcolepsy.  He saw neurology.  I recommend not to take propanolol since the drops of blood pressure.  Recommend to try low-dose hydroxyzine to help his anxiety when he is going outside for a crowded place to help his anxiety.  Discussed medication side effects and benefits.  We will try hydroxyzine 10 mg tablet to take 1-2 as needed.  Continue Effexor 75 mg daily.  Patient preferred to have his medication sent to [facility].  Recommended to call us back if he has any question or  any concern.  Follow-up in 3 months.  Follow Up Instructions:    I discussed the assessment and treatment plan with the patient. The patient was provided an opportunity to ask questions and all were answered. The patient agreed with the plan and demonstrated an understanding of the instructions.   The patient was advised to call back or seek an in-person evaluation if the symptoms worsen or if the condition fails to improve as anticipated.  I provided 15 minutes of non-face-to-face time during this encounter.   Kathlee Nations, MD

## 2019-08-24 ENCOUNTER — Ambulatory Visit (HOSPITAL_COMMUNITY): Payer: 59 | Admitting: Psychiatry

## 2019-08-24 ENCOUNTER — Telehealth: Payer: Self-pay | Admitting: Neurology

## 2019-08-24 NOTE — Telephone Encounter (Signed)
Called the patient and reviewed the lab work with him. 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 Advised that Dr Brett Fairy recommends completing the testing to rule out narcolepsy. Reviewed the medication list and advised the patient he would need to wean off effexor and hydralazine as well as stop caffeine tabs at least 14 days prior to the study. Advised the patient he would need to avoid over the counter decongestants and stimulants medications. Advised to not take any illegal drugs such as marijauna. Advised a drug screen will be completed for the narcolepsy test so that we are able to determine the test as valid. Pt verbalized understanding. Advised I will inform the sleep lab of his medications that he would need to stop prior to scheduling. Pt verbalized understanding.

## 2019-08-24 NOTE — Telephone Encounter (Signed)
-----   Message from Larey Seat, MD sent at 08/24/2019  8:51 AM EDT ----- Alpha allele positive for narcolepsy. One of 2 known narcolepsy associated factors has been positive.  further testing would include an MSLT , but only if the patient is not on REM sleep sup ressant medication. This means weaning of ( for 3 weeks) of SSRI, stimulants and of muscle-relaxants, sleep aids etc.

## 2019-10-04 ENCOUNTER — Ambulatory Visit: Payer: Self-pay | Admitting: Neurology

## 2019-11-21 ENCOUNTER — Ambulatory Visit (INDEPENDENT_AMBULATORY_CARE_PROVIDER_SITE_OTHER): Payer: 59 | Admitting: Psychiatry

## 2019-11-21 ENCOUNTER — Encounter (HOSPITAL_COMMUNITY): Payer: Self-pay | Admitting: Psychiatry

## 2019-11-21 ENCOUNTER — Other Ambulatory Visit: Payer: Self-pay

## 2019-11-21 DIAGNOSIS — F411 Generalized anxiety disorder: Secondary | ICD-10-CM

## 2019-11-21 DIAGNOSIS — F4010 Social phobia, unspecified: Secondary | ICD-10-CM

## 2019-11-21 MED ORDER — VENLAFAXINE HCL ER 75 MG PO CP24: 75.0000 mg | ORAL_CAPSULE | Freq: Every day | ORAL | 0 refills | Status: DC

## 2019-11-21 NOTE — Progress Notes (Signed)
Virtual Visit via Telephone Note  I connected with Charles Hayden on 11/21/19 at  4:00 PM EST by telephone and verified that I am speaking with the correct person using two identifiers.   I discussed the limitations, risks, security and privacy concerns of performing an evaluation and management service by telephone and the availability of in person appointments. I also discussed with the patient that there may be a patient responsible charge related to this service. The patient expressed understanding and agreed to proceed.   History of Present Illness: Patient was evaluated by phone session.  He is taking Effexor which is helping his anxiety.  He is sleeping better.  He does go outside some time and that does not bother him as much.  He continues to struggle with hypersomnia.  He had blood test which was recommended by neurology but he is still waiting for the results.  He wants [place] to send our records to Dr. Brett Fairy.  Patient told that he was under the impression that he has narcolepsy but he has not given any medication.  We have recommended to try propanolol if he gets very anxious but so far he has not needed to use it.  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  His job is going well.  He is working from home.  He has no tremors, shakes or any EPS.  His energy level is good.  He denies drinking or using any illegal substances.  His weight is unchanged from the past.   Past Psychiatric History:Reviewed. 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 No h/oinpatient treatment,suicidal attempt, mania and psychosis.Saw Dr. Cheryln Manly for CBT. Try propanolol, BuSpar and Lexapro but stopped on his own. H/Oseizures while living in [place].     Psychiatric Specialty Exam: Physical Exam  ROS  There were no vitals taken for this visit.There is no height or weight on file to calculate BMI.  General Appearance: NA  Eye Contact:  NA  Speech:  Clear and Coherent and Slow   Volume:  Normal  Mood:  Euthymic  Affect:  NA  Thought Process:  Goal Directed  Orientation:  Full (Time, Place, and Person)  Thought Content:  WDL and Logical  Suicidal Thoughts:  No  Homicidal Thoughts:  No  Memory:  Immediate;   Good Recent;   Good Remote;   Good  Judgement:  Good  Insight:  Good  Psychomotor Activity:  NA  Concentration:  Concentration: Good and Attention Span: Good  Recall:  Good  Fund of Knowledge:  Good  Language:  Good  Akathisia:  No  Handed:  Right  AIMS (if indicated):     Assets:  Communication Skills Desire for [place] Talents/Skills Transportation  ADL's:  Intact  Cognition:  WNL  Sleep:   ok      Assessment and Plan: Generalized anxiety disorder.  Social anxiety disorder.  Rule out narcolepsy.  Patient anxiety is under control with Effexor.  He has not taken hydroxyzine or propranolol so far since the last visit.  He is tolerating his medication very well.  Since working from home he has not been exposed to crowded places but when he goes outside he feels less anxious.  Continue Effexor 75 mg daily.  I will forward my note to his neurology as patient is waiting to get answers from neurology for the narcolepsy.  I recommended to call us back if is any question of any concern.  Follow-up in  3 months.  Follow Up Instructions:    I discussed the assessment and treatment plan with the patient. The patient was provided an opportunity to ask questions and all were answered. The patient agreed with the plan and demonstrated an understanding of the instructions.   The patient was advised to call back or seek an in-person evaluation if the symptoms worsen or if the condition fails to improve as anticipated.  I provided 20 minutes of non-face-to-face time during this encounter.   Kathlee Nations, MD

## 2019-11-25 ENCOUNTER — Other Ambulatory Visit (HOSPITAL_COMMUNITY): Payer: Self-pay | Admitting: Psychiatry

## 2019-11-25 DIAGNOSIS — F4010 Social phobia, unspecified: Secondary | ICD-10-CM

## 2020-01-27 ENCOUNTER — Other Ambulatory Visit (HOSPITAL_COMMUNITY): Payer: Self-pay | Admitting: Psychiatry

## 2020-01-27 DIAGNOSIS — F4010 Social phobia, unspecified: Secondary | ICD-10-CM

## 2020-01-27 DIAGNOSIS — F411 Generalized anxiety disorder: Secondary | ICD-10-CM

## 2020-02-13 ENCOUNTER — Other Ambulatory Visit (HOSPITAL_COMMUNITY): Payer: Self-pay | Admitting: Psychiatry

## 2020-02-13 DIAGNOSIS — F411 Generalized anxiety disorder: Secondary | ICD-10-CM

## 2020-02-13 DIAGNOSIS — F4010 Social phobia, unspecified: Secondary | ICD-10-CM

## 2020-02-20 ENCOUNTER — Other Ambulatory Visit: Payer: Self-pay

## 2020-02-20 ENCOUNTER — Encounter (HOSPITAL_COMMUNITY): Payer: Self-pay | Admitting: Psychiatry

## 2020-02-20 ENCOUNTER — Ambulatory Visit (INDEPENDENT_AMBULATORY_CARE_PROVIDER_SITE_OTHER): Payer: 59 | Admitting: Psychiatry

## 2020-02-20 DIAGNOSIS — F411 Generalized anxiety disorder: Secondary | ICD-10-CM | POA: Diagnosis not present

## 2020-02-20 DIAGNOSIS — F4010 Social phobia, unspecified: Secondary | ICD-10-CM

## 2020-02-20 MED ORDER — VENLAFAXINE HCL ER 75 MG PO CP24: 75.0000 mg | ORAL_CAPSULE | Freq: Every day | ORAL | 0 refills | Status: DC

## 2020-02-20 NOTE — Progress Notes (Signed)
Virtual Visit via Telephone Note  I connected with Charles Hayden on 02/20/20 at  4:00 PM EST by telephone and verified that I am speaking with the correct person using two identifiers.   I discussed the limitations, risks, security and privacy concerns of performing an evaluation and management service by telephone and the availability of in person appointments. I also discussed with the patient that there may be a patient responsible charge related to this service. The patient expressed understanding and agreed to proceed.   History of Present Illness: Patient was evaluated by phone session.  He is taking Effexor 75 mg.  He had tried one-time hydroxyzine but it made him very sleepy and he stopped taking it.  Overall he feels his anxiety and nervousness is not as bad since he does not go outside as much due to Covid.  He is working from home.  He is taking over-the-counter caffeine tablets which help his hypersomnia.  Patient reported he is still fall asleep on the desk.  He had sleep study but he was told the results are inconclusive and he was not given any medication.  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  His job is going okay.  His energy level is good.  He denies any irritability, anger, mania or any psychosis.  He wants to keep his current venlafaxine dose which is helping his social and generalized anxiety.     Past Psychiatric History:Reviewed. 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 No h/oinpatient treatment,suicidal attempt, mania and psychosis.Saw Dr. Cheryln Manly for CBT. Try propanolol, BuSpar and Lexapro but stopped on his own. H/Oseizures while living in [place].   Psychiatric Specialty Exam: Physical Exam  Review of Systems  There were no vitals taken for this visit.There is no height or weight on file to calculate BMI.  General Appearance: NA  Eye Contact:  NA  Speech:  Clear and Coherent and Normal Rate  Volume:  Normal  Mood:   Euthymic  Affect:  NA  Thought Process:  Goal Directed  Orientation:  Full (Time, Place, and Person)  Thought Content:  WDL  Suicidal Thoughts:  No  Homicidal Thoughts:  No  Memory:  Immediate;   Good Recent;   Good Remote;   Good  Judgement:  Good  Insight:  Present  Psychomotor Activity:  NA  Concentration:  Concentration: Good and Attention Span: Good  Recall:  Good  Fund of Knowledge:  Good  Language:  Good  Akathisia:  No  Handed:  Right  AIMS (if indicated):     Assets:  Communication Skills Desire for Improvement Housing Resilience Social Support Talents/Skills Transportation  ADL's:  Intact  Cognition:  WNL  Sleep:   ok      Assessment and Plan: Generalized anxiety disorder.  Social anxiety disorder.  Patient like to keep his Effexor 75 mg daily which is helping his anxiety and he is tolerating medication without any side effects.  I recommend to follow-up with neurology if he continues to feel struggle with hypersomnia.  He agreed to give them a call.  Discussed medication side effects and benefits.  Continue Effexor 75 mg daily.  Recommended to call us back if is any question or any concern.  Follow-up in 3 months.  Follow Up Instructions:    I discussed the assessment and treatment plan with the patient. The patient was provided an opportunity to ask questions and all were answered. The patient agreed with the plan and demonstrated an understanding of  the instructions.   The patient was advised to call back or seek an in-person evaluation if the symptoms worsen or if the condition fails to improve as anticipated.  I provided 15 minutes of non-face-to-face time during this encounter.   Kathlee Nations, MD

## 2020-02-21 ENCOUNTER — Other Ambulatory Visit: Payer: Self-pay | Admitting: Neurology

## 2020-02-21 ENCOUNTER — Encounter: Payer: Self-pay | Admitting: Neurology

## 2020-02-21 DIAGNOSIS — G4711 Idiopathic hypersomnia with long sleep time: Secondary | ICD-10-CM

## 2020-02-21 DIAGNOSIS — R5382 Chronic fatigue, unspecified: Secondary | ICD-10-CM

## 2020-02-21 DIAGNOSIS — G4710 Hypersomnia, unspecified: Secondary | ICD-10-CM

## 2020-02-21 DIAGNOSIS — G4719 Other hypersomnia: Secondary | ICD-10-CM

## 2020-02-24 NOTE — Telephone Encounter (Signed)
Myriam Jacobson, please share with patient.   Adrafenil is the european MODAFINIL and needs to be out of your system for the MSLT to be valid- you can take a half dose for 8 days and be off for 14 days prior to testing.   Noorpept is a supplement with psycho-stimulatory effect- recommend the same regimen as for Adrafenil. Be completely off for 14 days.    Carphedon, auch Phenylpiracetam, ist ein Phenyl-Derivat des [place] Piracetam. Es steigert die physische Leistungsfhigkeit und hebt die Toleranzschwelle gegen Klte und Stress- I haven't found any information about potential REM sleep suppression - to be sure it won't interfere I like you to be off for 14 days.  No caffeine intake ( chocolate, tea, coffee, soda)  the day before and the days of sleep testing.   Larey Seat, MD

## 2020-03-22 ENCOUNTER — Encounter: Payer: Self-pay | Admitting: Neurology

## 2020-04-07 ENCOUNTER — Ambulatory Visit: Payer: 59 | Attending: Internal Medicine

## 2020-04-07 DIAGNOSIS — Z23 Encounter for immunization: Secondary | ICD-10-CM

## 2020-04-07 NOTE — Progress Notes (Signed)
   Covid-19 Vaccination Clinic  Name:  Charles Hayden    MRN: [id number] DOB: 06-Mar-1996  04/07/2020  Charles Hayden was observed post Covid-19 immunization for 15 minutes without incident. He was provided with Vaccine Information Sheet and instruction to access the V-Safe system.   Charles Hayden was instructed to call 911 with any severe reactions post vaccine: Marland Kitchen Difficulty breathing  . Swelling of face and throat  . A fast heartbeat  . A bad rash all over body  . Dizziness and weakness   Immunizations Administered    Name Date Dose VIS Date Route   Pfizer COVID-19 Vaccine 04/07/2020  4:59 PM 0.3 mL 12/09/2019 Intramuscular   Manufacturer: [place]   Lot: [id number]   [place]: [id number]

## 2020-04-09 ENCOUNTER — Ambulatory Visit (INDEPENDENT_AMBULATORY_CARE_PROVIDER_SITE_OTHER): Payer: 59 | Admitting: Neurology

## 2020-04-09 DIAGNOSIS — G4711 Idiopathic hypersomnia with long sleep time: Secondary | ICD-10-CM | POA: Diagnosis not present

## 2020-04-09 DIAGNOSIS — G4710 Hypersomnia, unspecified: Secondary | ICD-10-CM

## 2020-04-09 DIAGNOSIS — G4719 Other hypersomnia: Secondary | ICD-10-CM

## 2020-04-09 DIAGNOSIS — R5382 Chronic fatigue, unspecified: Secondary | ICD-10-CM

## 2020-04-10 ENCOUNTER — Other Ambulatory Visit: Payer: Self-pay | Admitting: Neurology

## 2020-04-10 ENCOUNTER — Ambulatory Visit (INDEPENDENT_AMBULATORY_CARE_PROVIDER_SITE_OTHER): Payer: 59 | Admitting: Neurology

## 2020-04-10 ENCOUNTER — Other Ambulatory Visit: Payer: Self-pay

## 2020-04-10 DIAGNOSIS — G4711 Idiopathic hypersomnia with long sleep time: Secondary | ICD-10-CM

## 2020-04-10 DIAGNOSIS — R0683 Snoring: Secondary | ICD-10-CM

## 2020-04-10 DIAGNOSIS — G4710 Hypersomnia, unspecified: Secondary | ICD-10-CM

## 2020-04-10 DIAGNOSIS — Z79899 Other long term (current) drug therapy: Secondary | ICD-10-CM

## 2020-04-10 DIAGNOSIS — G4719 Other hypersomnia: Secondary | ICD-10-CM

## 2020-04-10 DIAGNOSIS — R5382 Chronic fatigue, unspecified: Secondary | ICD-10-CM

## 2020-04-10 NOTE — Addendum Note (Signed)
Addended by: Inis Sizer D on: 04/10/2020 04:35 PM   Modules accepted: Orders

## 2020-04-13 LAB — COMPREHENSIVE DRUG ANALYSIS,UR

## 2020-04-13 NOTE — Progress Notes (Signed)
MSLT related Clean catch - negative tox screen

## 2020-04-23 DIAGNOSIS — G4719 Other hypersomnia: Secondary | ICD-10-CM | POA: Insufficient documentation

## 2020-04-23 DIAGNOSIS — R5382 Chronic fatigue, unspecified: Secondary | ICD-10-CM | POA: Insufficient documentation

## 2020-04-23 DIAGNOSIS — G4710 Hypersomnia, unspecified: Secondary | ICD-10-CM | POA: Insufficient documentation

## 2020-04-23 NOTE — Progress Notes (Signed)
MSLT to follow

## 2020-04-23 NOTE — Progress Notes (Signed)
IMPRESSION:   1. This multiple sleep latency test reveals a mean sleep latency  of 8.3 minutes with 1 sleep periods during which REM sleep was  recorded. A total of 5 sleep periods in 5 nap opportunities were  recorded.   2. This study was preceded by an overnight polysomnogram with a  total sleep time (TST) of 485.5 minutes.    RECOMMENDATIONS:  This MSLT study reveals one REM sleep onset, and several naps  with what appears to be a possible REM onset. But not lasting 60  seconds, therefore not long enough to score.  This study is certainly consistent with hypersomnolence.  This study, in the proper clinical scenario, may still be  consistent with a diagnosis of narcolepsy.

## 2020-04-23 NOTE — Procedures (Signed)
PATIENT'S NAME:  Charles Hayden, Charles Hayden DOB:      22-Aug-1996      MR#:    [id number]     DATE OF RECORDING: 04/09/2020 REFERRING M.D.:  Dr. Adele Schilder , MD Study Performed:   Baseline Polysomnogram for MSLT to follow.  HISTORY:  This is a baseline PSG in preparation to an MSLT study in a patient with excessive daytime sleepiness and sleep attacks, vivid dreams.   The patient endorsed the Epworth Sleepiness Scale at 16 points.   The patient's weight 154 pounds with a height of 67 (inches), resulting in a BMI of 24.2 kg/m2. The patient's neck circumference measured 15.25 inches.  CURRENT MEDICATIONS: Caffeine tabs, Inderal, patient had weaned off Effexor.   PROCEDURE:  This is a multichannel digital polysomnogram utilizing the Somnostar 11.2 system.  Electrodes and sensors were applied and monitored per AASM Specifications.   EEG, EOG, Chin and Limb EMG, were sampled at 200 Hz.  ECG, Snore and Nasal Pressure, Thermal Airflow, Respiratory Effort, CPAP Flow and Pressure, Oximetry was sampled at 50 Hz. Digital video and audio were recorded.      BASELINE STUDY: Lights Out was at 20:53 and Lights On at 06:01.  Total recording time (TRT) was 548 minutes, with a total sleep time (TST) of 485.5 minutes.  The patient's sleep latency was 4 minutes.  REM latency was 54.5 minutes.  The sleep efficiency was 88.6 %.     SLEEP ARCHITECTURE: WASO (Wake after sleep onset) was 58 minutes.  There were 34.5 minutes in Stage N1, 126 minutes Stage N2, 192.5 minutes Stage N3 and 132.5 minutes in Stage REM.  The percentage of Stage N1 was 7.1%, Stage N2 was 26.%, Stage N3 was 39.6% and Stage R (REM sleep) was 27.3%.   RESPIRATORY ANALYSIS:  There were a total of 48 respiratory events:  0 obstructive apneas, 8 central apneas and 5 mixed apneas with a total of 13 apneas and 35 hypopneas.     The total APNEA/HYPOPNEA INDEX (AHI) was 5.9/hour.  30 events occurred in REM sleep and 32 events in NREM. The REM AHI was 13.6 /hour, versus a non-REM  AHI of 3.1. The patient spent 308 minutes of total sleep time in the supine position and 178 minutes in non-supine. The supine AHI was 5.1 versus a non-supine AHI of 7.5.  OXYGEN SATURATION & C02:  The Wake baseline 02 saturation was 97%, with the lowest being 85%. Time spent below 89% saturation equaled 2 minutes. The arousals were noted as: 132 were spontaneous, 2 were associated with PLMs, and 33 were associated with respiratory events. The patient had a total of 6 Periodic Limb Movements.  The Periodic Limb Movement (PLM) Arousal index was 0.2/hour.  Audio and video analysis did not show any abnormal or unusual movements, behaviors, phonations or vocalizations.  There was a high proportion of REM sleep in 6 cycles noted.  Soft snoring was noted. EKG was in keeping with normal sinus rhythm (NSR).  IMPRESSION:  1. Very mild, mostly Obstructive Sleep Apnea (OSA). We decided to follow with MSLT nonetheless.   RECOMMENDATIONS:  MSLT to follow.   I certify that I have reviewed the entire raw data recording prior to the issuance of this report in accordance with the Standards of Accreditation of the American Academy of Sleep Medicine (AASM)   Larey Seat, MD Diplomat, American Board of Psychiatry and Neurology  Diplomat, American Board of Sleep Medicine Market researcher, [place] [facility] [employer]

## 2020-04-23 NOTE — Procedures (Signed)
  Name:  Charles Hayden, Charles Hayden Reference [id number]  Study Date: 04/10/2020 Procedure #: [id number]  DOB: 1996/09/26    Protocol  This is a 13 channel Multiple Sleep Latency Test comprised of 5 channels of EEG (T3-Cz, Cz-T4, F4-M1, C4-M1, O2-M1), 3 channels of Chin EMG, 4 channels of EOG and 1 channel for ECG.   All channels were sampled at 256hz .    This polysomnographic procedure is designed to evaluate (1) the complaint of excessive daytime sleepiness by quantifying the time required to fall asleep and (2) the possibility of narcolepsy by checking for abnormally short latencies to REM sleep.  Electrographic variables include EEG, EMG, EOG and ECG.  Patients are monitored throughout four or five 20-minute opportunities to sleep (naps) at two-hour intervals.  For each nap, the patient is allowed 20 minutes to fall asleep.  Once asleep, the patient is awakened after 15 minutes.  Between naps, the patient is kept as alert as possible.  A sleep latency of 20 minutes indicates that no sleep occurred.  Parametric Analysis  Total Number of Naps 5     NAP # Time of Nap  Sleep Latency (mins) REM Latency (mins) Sleep Time Percent Awake Time Percent  1 07:37 12 0 59 41   2 09:31 10 8  68  32   3 11:31 6 0 82  18   4 13:34 8.5 0 69  31   5 15:33 5 0 83  17    MSLT Summary of Naps  Sleepiness Index: 58.5  Mean Sleep Latency to all Five Naps: 8.3  Mean Sleep Latency to First Four Naps: 9.1  Mean Sleep Latency to First Three Naps: 9.3  Mean Sleep Latency to First Two Naps: 11  Number of Naps with REM Sleep: 1    Results from Preceding PSG Study  Sleep Onset Time 20:57 Sleep Efficiency (%) 88.6 %  Rise Time 06:01 Sleep Latency (min) 4 min  Total Sleep Time  8.1 H REM Latency (min) 54.5 min          Name:  [name], Cantley Reference #:  [id number]  Study Date: 04/10/2020 DOB: 1996-07-25    IMPRESSION:  1. This multiple sleep latency test reveals a mean sleep latency of 8.3 minutes with 1 sleep periods  during which REM sleep was recorded.  A total of 5 sleep periods in 5 nap opportunities were recorded.   2. This study was preceded by an overnight polysomnogram with a total sleep time (TST) of 485.5 minutes.      RECOMMENDATIONS: This MSLT study reveals one REM sleep onset, and several naps with what appears to be a possible REM onset. But not lasting 60 seconds, therefore not long enough to score.  This study is certainly consistent with hypersomnolence. This study, in the proper clinical scenario, may still be consistent with a diagnosis of narcolepsy.   I attest to having reviewed every epoch of the entire raw data recording prior to the issuance of this report in accordance with the Standards of the [place] of Sleep Medicine.       Larey Seat, M.D. Diplomat, Tax adviser of Psychiatry and Neurology  Diplomat, Tax adviser of Sleep Medicine Market researcher, [facility] [employer]

## 2020-04-24 ENCOUNTER — Telehealth: Payer: Self-pay | Admitting: Neurology

## 2020-04-24 ENCOUNTER — Encounter: Payer: Self-pay | Admitting: Neurology

## 2020-04-24 NOTE — Telephone Encounter (Signed)
Pt called and accepted an appointment for Thurs 04-29 with a 2:00 check in

## 2020-04-24 NOTE — Telephone Encounter (Signed)
Called patient to discuss sleep study results. No answer at this time. LVM for the patient to call back.  Will send a mychart message as well.   If pt calls back I was going to offer the opening that was available this week with Dr Brett Fairy to bring him in to review sleep study and establish a treatment plan.

## 2020-04-24 NOTE — Telephone Encounter (Signed)
-----   Message from Larey Seat, MD sent at 04/23/2020  1:04 PM EDT ----- IMPRESSION:   1. This multiple sleep latency test reveals a mean sleep latency  of 8.3 minutes with 1 sleep periods during which REM sleep was  recorded. A total of 5 sleep periods in 5 nap opportunities were  recorded.   2. This study was preceded by an overnight polysomnogram with a  total sleep time (TST) of 485.5 minutes.    RECOMMENDATIONS:  This MSLT study reveals one REM sleep onset, and several naps  with what appears to be a possible REM onset. But not lasting 60  seconds, therefore not long enough to score.  This study is certainly consistent with hypersomnolence.  This study, in the proper clinical scenario, may still be  consistent with a diagnosis of narcolepsy.

## 2020-04-26 ENCOUNTER — Ambulatory Visit (INDEPENDENT_AMBULATORY_CARE_PROVIDER_SITE_OTHER): Payer: 59 | Admitting: Neurology

## 2020-04-26 ENCOUNTER — Other Ambulatory Visit: Payer: Self-pay

## 2020-04-26 ENCOUNTER — Encounter: Payer: Self-pay | Admitting: Neurology

## 2020-04-26 VITALS — BP 117/71 | HR 74 | Temp 97.7°F | Ht 67.0 in | Wt 172.0 lb

## 2020-04-26 DIAGNOSIS — F39 Unspecified mood [affective] disorder: Secondary | ICD-10-CM | POA: Diagnosis not present

## 2020-04-26 DIAGNOSIS — G4733 Obstructive sleep apnea (adult) (pediatric): Secondary | ICD-10-CM | POA: Diagnosis not present

## 2020-04-26 DIAGNOSIS — G4719 Other hypersomnia: Secondary | ICD-10-CM | POA: Diagnosis not present

## 2020-04-26 MED ORDER — ARMODAFINIL 200 MG PO TABS: 200.0000 mg | ORAL_TABLET | Freq: Every morning | ORAL | 5 refills | Status: DC

## 2020-04-26 NOTE — Progress Notes (Signed)
SLEEP MEDICINE CLINIC    Provider:  Larey Seat, MD  Primary Care Physician:     Referring Provider: Dr Adele Schilder        Chief Complaint according to patient   Patient presents with:    . New Patient (Initial Visit)           HISTORY OF PRESENT ILLNESS:  Charles Hayden is a 24 y.o. year old Asian male patient seen here upon referral on 04/26/2020 ,  The patient reports that he is still mostly working from home, and has had some trouble with staying alert at work due to an overwhelming feeling of sleepiness. He underwent weaning off Effexor, before he was tested by a baseline polysomnography and then an MSLT to follow.  He had very mild obstructive sleep apnea but is not a sufficient explanation for his degree of sleepiness normal EKG, his AHI was 5.9.  Sleep efficiency was 88.6% most important is his REM sleep latency he did have 27.3% REM sleep during the night and his REM latency was over 50 minutes.  MSLT followed with the first nap at 7:37 AM he fell asleep within a 12 minutes latency second nap he fell asleep with a 10-minute latency and then with an 8 minutes into sleep he did have his first REM sleep onset.  The third nap had a latency of only 6 minutes the fourth of 8.5 minutes in the 50s at 5 minutes.  Overall this is clearly a pattern of severe hypersomnia but with a single REM sleep onset we are still not able to identify this clearly as narcolepsy.  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     Had been referred by Dr. Silvio Pate Dr. Adele Schilder for a sleep evalutaion.  Chief concern according to patient :  Excessive daytime sleepiness Rv 08-15-2019 - I have the pleasure of seeing Charles Hayden today in follow up , a 23-handed Asian male who  has a past medical history of Allergic rhinitis due to pollen and Anxiety.     In short, [name] is presenting with hypersomia with high fatigue and prolonged sleep time-  symptoms that can be  attributed to depression, and is alos seen in autoimmune disorders. His sleep study did not show any apnea, PLMs or even parasomnia. There was no physiological abnormality found, and the sleep architecy ture has been influenced by medication. The diagnosis was idiopathic hypersomnia and would not be followed up here.     PS :I had planned to follow up either personally or through our NP within 2 month if any organic sleep disorders were identified..  This is not the case, he continues to endorse the Epworth score at 14/ 24 points, elevated. Fatigue severity at 47/ 63 points, elevated. He denies chronic discomfort or pain, no joint pain, myalgia or infections. I offered a HLA narcolepsy test today, and if this is negative I would recommend to see counseling for depression.and possible use a stimulant medication.         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 During college he had trouble to get up in AM, was sleepy in class, falling asleep easily and fighting drowsiness when commuting home   Nocturia/ Enuresis None ,  Sleep walking: none , Night terrors none  Tonsillectomy none ,   Family medical /sleep history: unaware of other family members on  CPAP with OSA, insomnia, sleep walkers.  MGF dementia.    Social history:  Patient is working as a Programme researcher, broadcasting/film/video and lives in a household with 3 persons- with his parents. . Family status is single.  The patient currently works regular hours. Pets are not present. Tobacco use: none .  ETOH use; none ,  Caffeine intake in form of Caffeine tablets. Regular exercise: none.   Hobbies :none.    Sleep habits are as follows: The patient's dinner time is between 11- 12 PM.  The patient goes to bed at 2 AM and falls asleep easily- he continues to sleep for 6-8 hours, wakes rarely for bathroom breaks.    The preferred sleep position is side , with the support of 1 pillows.  Dreams are reportedly frequent/vivid/ nightmarish.    9 AM is the usual rise time.  The patient wakes up after multiple alarms.  He reports not feeling refreshed or restored in AM, or rarely so.  He reports neither dry mouth,  morning headaches but residual fatigue.  Naps are taken unscheduled but frequently, lasting from  2-3 hours and are more refreshing than nocturnal sleep.    Review of Systems: Out of a complete 14 system review, the patient complains of only the following symptoms, and all other reviewed systems are negative.:  Fatigue, sleepiness , snoring, fragmented sleep, hypersomnia    How likely are you to doze in the following situations: 0 = not likely, 1 = slight chance, 2 = moderate chance, 3 = high chance   Sitting and Reading?  Watching Television? Sitting inactive in a public place (theater or meeting)? As a passenger in a car for an hour without a break? Lying down in the afternoon when circumstances permit? Sitting and talking to someone? Sitting quietly after lunch without alcohol? In a car, while stopped for a few minutes in traffic?   Total = 18/ 24 points   FSS endorsed at 39/ 63 points.   Social History   Socioeconomic History  . Marital status: Single    Spouse name: Not on file  . Number of children: 0  . Years of education: Not on file  . Highest education level: Not on file  Occupational History  . Not on file  Tobacco Use  . Smoking status: Never Smoker  . Smokeless tobacco: Never Used  Substance and Sexual Activity  . Alcohol use: No  . Drug use: No  . Sexual activity: Not Currently  Other Topics Concern  . Not on file  Social History Narrative   Attended [employer] for [employer]. Now going to [employer] for [employer]. Lives in [place] with his parents. He has 1/2 brother in [place]. Pt used to work with is [employer] but now is only going to school.    Social Determinants of Health   Financial Resource Strain:   .  Difficulty of Paying Living Expenses:   Food Insecurity:   . Worried About Charity fundraiser in the Last Year:   . Arboriculturist in the Last Year:   Transportation Needs:   . Film/video editor (Medical):   Marland Kitchen Lack of Transportation (Non-Medical):   Physical Activity:   . Days of Exercise per Week:   . Minutes of Exercise per Session:   Stress:   . Feeling of Stress :   Social Connections:   . Frequency of Communication with Friends and Family:   . Frequency of Social Gatherings with  Friends and Family:   . Attends Religious Services:   . Active Member of Clubs or Organizations:   . Attends Archivist Meetings:   Marland Kitchen Marital Status:     Family History  Problem Relation Age of Onset  . Alzheimer's disease Maternal Grandfather   . Heart disease Neg Hx   . Diabetes Neg Hx     Past Medical History:  Diagnosis Date  . Allergic rhinitis due to pollen   . Anxiety     Past Surgical History:  Procedure Laterality Date  . APPENDECTOMY  03/31/2016   Dr. Dahlia Byes  . LAPAROSCOPIC APPENDECTOMY N/A 03/31/2016   Procedure: APPENDECTOMY LAPAROSCOPIC;  Surgeon: Jules Husbands, MD;  Location: [facility] ORS;  Service: General;  Laterality: N/A;     Current Outpatient Medications on File Prior to Visit  Medication Sig Dispense Refill  . caffeine 200 MG TABS tablet Take 200 mg by mouth every 6 (six) hours as needed.    . hydrOXYzine (ATARAX/VISTARIL) 10 MG tablet TAKE 1 TO 2 TABLETS AS NEEDED FOR ANXIETY 40 tablet 0  . venlafaxine XR (EFFEXOR-XR) 75 MG 24 hr capsule Take 1 capsule (75 mg total) by mouth daily with breakfast. 90 capsule 0   No current facility-administered medications on file prior to visit.    Allergies  Allergen Reactions  . Food     Tested via allergist, allergic peanuts...given Epi pen 04/23/11  . Pollen Extract     Intradermal testing, 04/23/11    Physical exam:  Today's Vitals   04/26/20 1407  BP: 117/71  Pulse: 74  Temp: 97.7 F (36.5 C)  Weight:  172 lb (78 kg)  Height: 5' 7"  (1.702 m)   Body mass index is 26.94 kg/m.   Wt Readings from Last 3 Encounters:  04/26/20 172 lb (78 kg)  08/15/19 159 lb (72.1 kg)  06/20/19 154 lb (69.9 kg)     Ht Readings from Last 3 Encounters:  04/26/20 5' 7"  (1.702 m)  08/15/19 5' 7"  (1.702 m)  06/20/19 5' 7"  (1.702 m)      General: The patient is awake, alert and appears not in acute distress. The patient is well groomed. Head: Normocephalic, atraumatic. Neck is supple. Mallampati 1,  neck circumference: 15. 25  inches . Nasal airflow is patent.  Retrognathia is not seen.  Dental status:  Cardiovascular:  Regular rate and cardiac rhythm by pulse,  without distended neck veins. Respiratory: Lungs are clear to auscultation.  Skin:  Without evidence of ankle edema, or rash. Acne .  Trunk: The patient's posture is erect.   Neurologic exam : The patient is awake and alert, oriented to place and time.   Memory subjective described as intact.  Attention span & concentration ability appears normal.  Speech is fluent,  without  dysarthria, dysphonia or aphasia.  Mood and affect are appropriate.   Cranial nerves: no loss of smell or taste reported  Pupils are equal and briskly reactive to light. Facial motor strength is symmetric and tongue and uvula move midline.  Neck ROM : rotation, tilt and flexion extension were normal for age and shoulder shrug was symmetrical.    Motor exam:  Symmetric bulk, tone and ROM.   Normal tone without cog wheeling, symmetric grip strength .  Deep tendon reflexes: in the  upper and lower extremities are symmetric and intact.  Babinski response was deferred normal.        After spending a total time of 15 minutes face to  face and additional time for physical and neurologic examination, review of laboratory studies,  personal review of imaging studies, reports and results of other testing and review of referral information / records as far as provided in visit, I  have established the following assessments:  1) hypersomnia with nightmares. No sleep paralysis, no dream intrusion, no cataplexy. HLA positive for one allel.  2) high fatigue, anxiety 3) caffeine use/ abuse/ overuse. 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 I will treate with Modafinil or Armodafinil.     CC: I will share my notes with PCP and Dr Adele Schilder. No follow up is needed  if HLA returns negative.   Electronically signed by: Larey Seat, MD 04/26/2020 2:12 PM  [facility] Board certified by The [employer] of Sleep Medicine and Diplomate of the [employer] of Sleep Medicine. Board certified In Neurology through the [place], Fellow of the [employer] of Neurology. Medical Director of [facility].

## 2020-04-26 NOTE — Patient Instructions (Signed)
Armodafinil tablets What is this medicine? ARMODAFINIL (ar moe DAF i nil) is used to treat excessive sleepiness caused by certain sleep disorders. This includes narcolepsy, sleep apnea, and shift work sleep disorder. This medicine may be used for other purposes; ask your health care provider or pharmacist if you have questions. COMMON BRAND NAME(S): Nuvigil What should I tell my health care provider before I take this medicine? They need to know if you have any of these conditions:  bipolar disorder  depression  drug or alcohol abuse or addiction  heart disease  high blood pressure  kidney disease  liver disease  schizophrenia  suicidal thoughts, plans, or attempt; a previous suicide attempt by you or a family member  an unusual or allergic reaction to armodafinil, modafinil, medicines, foods, dyes, or preservatives  pregnant or trying to get pregnant  breast-feeding How should I use this medicine? Take this medicine by mouth with a glass of water. Follow the directions on the prescription label. Take your doses at regular intervals. Do not take your medicine more often than directed. Do not stop taking this medicine suddenly except upon the advice of your doctor. Stopping this medicine too quickly may cause serious side effects or your condition may worsen. A special MedGuide will be given to you by the pharmacist with each prescription and refill. Be sure to read this information carefully each time. Talk to your pediatrician regarding the use of this medicine in children. While this drug may be prescribed for children as young as 17 years of age for selected conditions, precautions do apply. Overdosage: If you think you have taken too much of this medicine contact a poison control center or emergency room at once. NOTE: This medicine is only for you. Do not share this medicine with others. What if I miss a dose? If you miss a dose, take it as soon as you can. If it is almost  time for your next dose, take only that dose. Do not take double or extra doses. What may interact with this medicine? Do not take this medicine with any of the following medications:  amphetamine or dextroamphetamine  dexmethylphenidate or methylphenidate  MAOIs like Carbex, Eldepryl, Marplan, Nardil, and Parnate  pemoline  procarbazine This medicine may also interact with the following medications:  antifungal medicines like itraconazole or ketoconazole  barbiturates, like phenobarbital  birth control pills or other hormone-containing birth control devices or implants  carbamazepine  cyclosporine  diazepam  medicines for depression, anxiety, or psychotic disturbances  phenytoin  propranolol  triazolam  warfarin This list may not describe all possible interactions. Give your health care provider a list of all the medicines, herbs, non-prescription drugs, or dietary supplements you use. Also tell them if you smoke, drink alcohol, or use illegal drugs. Some items may interact with your medicine. What should I watch for while using this medicine? Visit your doctor or healthcare provider for regular checks on your progress. The full effect of this medicine may not be seen right away. This medicine may affect your concentration, function, or may hide signs that you are tired. You may get dizzy. This medicine will not eliminate your abnormal tendency to fall asleep and is not a replacement for sleep. Do not change your previous behavior regarding potentially dangerous activities, such as driving, using machinery, or doing anything that needs mental alertness until you know how this drug affects you. Alcohol can make you more dizzy and may interfere with your response to this medicine or   your alertness. Avoid alcoholic drinks. This medicine may cause serious skin reactions. They can happen weeks to months after starting the medicine. Contact your healthcare provider right away if  you notice fevers or flu-like symptoms with a rash. The rash may be red or purple and then turn into blisters or peeling of the skin. Or, you might notice a red rash with swelling of the face, lips, or lymph nodes in your neck or under your arms. Birth control pills may not work properly while you are taking this medicine. While using birth control pills, you will need an additional barrier method or an alternative non-hormonal method of birth control during treatment with armodafinil and for 1 month after stopping armodafinil. Talk to your doctor about which extra method of birth control is right for you. It is unknown if the effects of this medicine will be increased by the use of caffeine. Caffeine is found in many foods, beverages, and medications. Ask your doctor if you should limit or change your intake of caffeine-containing products while on this medicine. Do not stop previously prescribed treatments for your condition, such as a CPAP machine, except on the advise of your physician or healthcare provider. What side effects may I notice from receiving this medicine? Side effects that you should report to your doctor or health care professional as soon as possible:  allergic reactions like skin rash, itching or hives, swelling of the face, lips, or tongue  anxiety  breathing or swallowing problems  chest pain  depressed mood  elevated mood, decreased need for sleep, racing thoughts, impulsive behavior  fast, irregular heartbeat  hallucination, loss of contact with reality  increased blood pressure  mouth sores, blisters, or peeling skin  rash, fever, and swollen lymph nodes  redness, blistering, peeling, or loosening of the skin, including inside the mouth  sore throat, fever, or chills  suicidal thoughts or other mood changes  tremors  vomiting Side effects that usually do not require medical attention (report to your doctor or health care professional if they continue or  are bothersome):  headache  nausea, diarrhea, or stomach upset  nervousness  trouble sleeping This list may not describe all possible side effects. Call your doctor for medical advice about side effects. You may report side effects to FDA at 1-800-FDA-1088. Where should I keep my medicine? Keep out of the reach of children. Store at room temperature between 20 and 25 degrees C (68 and 77 degrees F). Throw away any unused medicine after the expiration date. NOTE: This sheet is a summary. It may not cover all possible information. If you have questions about this medicine, talk to your doctor, pharmacist, or health care provider.  2020 Elsevier/Gold Standard (2019-03-08 10:01:22)  

## 2020-05-01 ENCOUNTER — Ambulatory Visit: Payer: 59 | Attending: Internal Medicine

## 2020-05-01 DIAGNOSIS — Z23 Encounter for immunization: Secondary | ICD-10-CM

## 2020-05-01 NOTE — Progress Notes (Signed)
   Covid-19 Vaccination Clinic  Name:  Charles Hayden    MRN: [id number] DOB: June 17, 1996  05/01/2020  Mr. Charles Hayden was observed post Covid-19 immunization for 15 minutes without incident. He was provided with Vaccine Information Sheet and instruction to access the V-Safe system.   Mr. Charles Hayden was instructed to call 911 with any severe reactions post vaccine: Marland Kitchen Difficulty breathing  . Swelling of face and throat  . A fast heartbeat  . A bad rash all over body  . Dizziness and weakness   Immunizations Administered    Name Date Dose VIS Date Route   Pfizer COVID-19 Vaccine 05/01/2020  4:12 PM 0.3 mL 02/22/2019 Intramuscular   Manufacturer: [place]   Lot: [id number]   [place]: [id number]

## 2020-05-02 ENCOUNTER — Other Ambulatory Visit (HOSPITAL_COMMUNITY): Payer: Self-pay | Admitting: Psychiatry

## 2020-05-02 DIAGNOSIS — F4010 Social phobia, unspecified: Secondary | ICD-10-CM

## 2020-05-02 DIAGNOSIS — F411 Generalized anxiety disorder: Secondary | ICD-10-CM

## 2020-05-17 ENCOUNTER — Telehealth (INDEPENDENT_AMBULATORY_CARE_PROVIDER_SITE_OTHER): Payer: 59 | Admitting: Psychiatry

## 2020-05-17 ENCOUNTER — Encounter (HOSPITAL_COMMUNITY): Payer: Self-pay | Admitting: Psychiatry

## 2020-05-17 ENCOUNTER — Other Ambulatory Visit: Payer: Self-pay

## 2020-05-17 DIAGNOSIS — F4010 Social phobia, unspecified: Secondary | ICD-10-CM

## 2020-05-17 DIAGNOSIS — F411 Generalized anxiety disorder: Secondary | ICD-10-CM

## 2020-05-17 MED ORDER — VENLAFAXINE HCL ER 75 MG PO CP24: 75.0000 mg | ORAL_CAPSULE | Freq: Every day | ORAL | 0 refills | Status: DC

## 2020-05-17 NOTE — Progress Notes (Signed)
Virtual Visit via Video Note  I connected with Charles Hayden on 05/17/20 at  4:00 PM EDT by a video enabled telemedicine application and verified that I am speaking with the correct person using two identifiers.   I discussed the limitations of evaluation and management by telemedicine and the availability of in person appointments. The patient expressed understanding and agreed to proceed.  History of Present Illness: Patient is evaluated by video session.  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  Patient reported for 1 month he has to stop the Effexor for study and it was difficult because he was having a Hayden of anxiety.  He is back on Effexor and he is showing improvement in his nervousness, anxiety.  He is able to go outside and he does not feel overwhelmed.  He reported armodafinil 200 mg is helping his hypersomnia and he is up, attentive and does not sleep while working.  His appetite is okay.  He has not noticed worsening of anxiety with his stimulant.  He has no tremors or shakes.  His job is going well.  Like to continue Effexor 75 mg daily.   Psychiatric Specialty Exam: Physical Exam  Review of Systems  Weight 172 lb (78 kg).There is no height or weight on file to calculate BMI.  General Appearance: Casual  Eye Contact:  Fair  Speech:  Slow  Volume:  Normal  Mood:  Euthymic  Affect:  Congruent  Thought Process:  Goal Directed  Orientation:  Full (Time, Place, and Person)  Thought Content:  WDL  Suicidal Thoughts:  No  Homicidal Thoughts:  No  Memory:  Immediate;   Good Recent;   Good Remote;   Good  Judgement:  Good  Insight:  Good  Psychomotor Activity:  Normal  Concentration:  Concentration: Good and Attention Span: Good  Recall:  Good  Fund of Knowledge:  Good  Language:  Good  Akathisia:  No  Handed:  Right  AIMS (if indicated):     Assets:  Communication Skills Desire for [place] Talents/Skills Transportation  ADL's:  Intact  Cognition:  WNL  Sleep:   ok      Assessment and Plan: Generalized anxiety disorder.  Social anxiety disorder.  Patient feeling the current medicine of Effexor 75 mg helping his anxiety and he is able to go outside and function.  He started taking armodafinil 200 mg it is helping his hypersomnia.  Discussed medication side effects and benefits.  I reviewed notes from neurology.  Recommended to continue current dose of Effexor and call us back if he has any questions or any concerns.  Follow-up in 3 months.   Follow Up Instructions:    I discussed the assessment and treatment plan with the patient. The patient was provided an opportunity to ask questions and all were answered. The patient agreed with the plan and demonstrated an understanding of the instructions.   The patient was advised to call back or seek an in-person evaluation if the symptoms worsen or if the condition fails to improve as anticipated.  I provided 15 minutes of non-face-to-face time during this encounter.   Kathlee Nations, MD

## 2020-08-15 ENCOUNTER — Telehealth (INDEPENDENT_AMBULATORY_CARE_PROVIDER_SITE_OTHER): Payer: 59 | Admitting: Psychiatry

## 2020-08-15 ENCOUNTER — Other Ambulatory Visit (HOSPITAL_COMMUNITY): Payer: Self-pay | Admitting: Psychiatry

## 2020-08-15 ENCOUNTER — Encounter (HOSPITAL_COMMUNITY): Payer: Self-pay | Admitting: Psychiatry

## 2020-08-15 ENCOUNTER — Other Ambulatory Visit: Payer: Self-pay

## 2020-08-15 DIAGNOSIS — F4010 Social phobia, unspecified: Secondary | ICD-10-CM | POA: Diagnosis not present

## 2020-08-15 DIAGNOSIS — F411 Generalized anxiety disorder: Secondary | ICD-10-CM | POA: Diagnosis not present

## 2020-08-15 MED ORDER — VENLAFAXINE HCL ER 75 MG PO CP24: 75.0000 mg | ORAL_CAPSULE | Freq: Every day | ORAL | 0 refills | Status: DC

## 2020-08-15 NOTE — Progress Notes (Signed)
Virtual Visit via Telephone Note  I connected with Merlyn Lot on 08/15/20 at  4:00 PM EDT by telephone and verified that I am speaking with the correct person using two identifiers.  Location: Patient: home Provider: home work   I discussed the limitations, risks, security and privacy concerns of performing an evaluation and management service by telephone and the availability of in person appointments. I also discussed with the patient that there may be a patient responsible charge related to this service. The patient expressed understanding and agreed to proceed.   History of Present Illness: Patient is stable on his meds. No issues with meds. Anxiety under controlled. Has not seen severe anxiety when go outside. 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 Has not taken Hydroxyzine in a while. Job going well. May need to go in person soon for work and some concern about it but no crying spells or hopelessness. Appetite and weight is good.  Like to keep his current medication.   Past Psychiatric History:Reviewed. 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 No h/oinpatient treatment,suicidal attempt, mania and psychosis.Saw Dr. Cheryln Manly for CBT. Try propanolol, BuSpar and Lexapro but stopped on his own. H/Oseizures while living in [place].   Psychiatric Specialty Exam: Physical Exam  Review of Systems  Weight 172 lb (78 kg).There is no height or weight on file to calculate BMI.  General Appearance: NA  Eye Contact:  NA  Speech:  Normal Rate  Volume:  Normal  Mood:  Euthymic  Affect:  NA  Thought Process:  Goal Directed  Orientation:  Full (Time, Place, and Person)  Thought Content:  WDL  Suicidal Thoughts:  No  Homicidal Thoughts:  No  Memory:  Immediate;   Good Recent;   Good Remote;   Good  Judgement:  Good  Insight:  Good  Psychomotor Activity:  NA  Concentration:  Concentration: Good and Attention Span: Good  Recall:  Good  Fund of Knowledge:  Good   Language:  Good  Akathisia:  No  Handed:  Right  AIMS (if indicated):     Assets:  Communication Skills Desire for Improvement Housing Resilience Social Support Transportation  ADL's:  Intact  Cognition:  WNL  Sleep:   ok      Assessment and Plan: GAD, SAD Stable on meds. No side effects. Continue Effexor 75 mg daily. Rec to call back if any questions. F/U 3 months  Follow Up Instructions:    I discussed the assessment and treatment plan with the patient. The patient was provided an opportunity to ask questions and all were answered. The patient agreed with the plan and demonstrated an understanding of the instructions.   The patient was advised to call back or seek an in-person evaluation if the symptoms worsen or if the condition fails to improve as anticipated.  I provided 15 minutes of non-face-to-face time during this encounter.   Kathlee Nations, MD

## 2020-11-13 ENCOUNTER — Other Ambulatory Visit (HOSPITAL_COMMUNITY): Payer: Self-pay | Admitting: Psychiatry

## 2020-11-13 DIAGNOSIS — F411 Generalized anxiety disorder: Secondary | ICD-10-CM

## 2020-11-13 DIAGNOSIS — F4010 Social phobia, unspecified: Secondary | ICD-10-CM

## 2020-11-15 ENCOUNTER — Other Ambulatory Visit: Payer: Self-pay

## 2020-11-15 ENCOUNTER — Encounter (HOSPITAL_COMMUNITY): Payer: Self-pay | Admitting: Psychiatry

## 2020-11-15 ENCOUNTER — Telehealth (INDEPENDENT_AMBULATORY_CARE_PROVIDER_SITE_OTHER): Payer: 59 | Admitting: Psychiatry

## 2020-11-15 DIAGNOSIS — F4010 Social phobia, unspecified: Secondary | ICD-10-CM

## 2020-11-15 DIAGNOSIS — F411 Generalized anxiety disorder: Secondary | ICD-10-CM | POA: Diagnosis not present

## 2020-11-15 MED ORDER — VENLAFAXINE HCL ER 75 MG PO CP24: 75.0000 mg | ORAL_CAPSULE | Freq: Every day | ORAL | 0 refills | Status: DC

## 2020-11-15 NOTE — Progress Notes (Signed)
Virtual Visit via Telephone Note  I connected with Charles Hayden on 11/15/20 at  4:00 PM EST by telephone and verified that I am speaking with the correct person using two identifiers.  Location: Patient: Home Provider: Home Office   I discussed the limitations, risks, security and privacy concerns of performing an evaluation and management service by telephone and the availability of in person appointments. I also discussed with the patient that there may be a patient responsible charge related to this service. The patient expressed understanding and agreed to proceed.   History of Present Illness: Patient is evaluated by phone session.  He has been taking venlafaxine every day.  He rarely takes hydroxyzine and has not taken in the recent months.  He feels his anxiety is under control and he does not feel as nervous and anxious as he used to.  He still have some time social anxiety but he is working from home.  His appetite is okay.  His energy level is good.  He is taking stimulants for hypersomnia.  He does not want to change the medication.  Past Psychiatric History:Reviewed. 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 No h/oinpatient treatment,suicidal attempt, mania and psychosis.Saw Dr. Cheryln Manly for CBT. Try propanolol, BuSpar and Lexapro but stopped on his own. H/Oseizures while living in [place].  Psychiatric Specialty Exam: Physical Exam  Review of Systems  Weight 165 lb (74.8 kg).There is no height or weight on file to calculate BMI.  General Appearance: NA  Eye Contact:  NA  Speech:  Normal Rate  Volume:  Normal  Mood:  Euthymic  Affect:  NA  Thought Process:  Goal Directed  Orientation:  Full (Time, Place, and Person)  Thought Content:  Logical  Suicidal Thoughts:  No  Homicidal Thoughts:  No  Memory:  Immediate;   Good Recent;   Good Remote;   Good  Judgement:  Good  Insight:  Present  Psychomotor Activity:  NA  Concentration:  Concentration:  Good and Attention Span: Good  Recall:  Good  Fund of Knowledge:  Good  Language:  Good  Akathisia:  No  Handed:  Right  AIMS (if indicated):     Assets:  Communication Skills Desire for Improvement Housing Resilience Social Support Talents/Skills  ADL's:  Intact  Cognition:  WNL  Sleep:   good      Assessment and Plan: Generalized anxiety disorder.  Social anxiety disorder.  Patient is a stable on his current medication.  Continue Effexor 75 mg daily.  Discussed medication side effects and benefits.  Recommended to call us back if is any question or any concern.  Follow-up in 3 months.  Follow Up Instructions:    I discussed the assessment and treatment plan with the patient. The patient was provided an opportunity to ask questions and all were answered. The patient agreed with the plan and demonstrated an understanding of the instructions.   The patient was advised to call back or seek an in-person evaluation if the symptoms worsen or if the condition fails to improve as anticipated.  I provided 11 minutes of non-face-to-face time during this encounter.   Kathlee Nations, MD

## 2020-12-14 ENCOUNTER — Ambulatory Visit: Payer: 59 | Attending: Internal Medicine

## 2020-12-14 DIAGNOSIS — Z23 Encounter for immunization: Secondary | ICD-10-CM

## 2020-12-14 NOTE — Progress Notes (Signed)
   Covid-19 Vaccination Clinic  Name:  Dexter Signor    MRN: [id number] DOB: June 22, 1996  12/14/2020  Mr. Staples was observed post Covid-19 immunization for 15 minutes without incident. He was provided with Vaccine Information Sheet and instruction to access the V-Safe system.   Mr. Mccubbin was instructed to call 911 with any severe reactions post vaccine: Marland Kitchen Difficulty breathing  . Swelling of face and throat  . A fast heartbeat  . A bad rash all over body  . Dizziness and weakness   Immunizations Administered    Name Date Dose VIS Date Route   Pfizer COVID-19 Vaccine 12/14/2020  5:19 PM 0.3 mL 10/17/2020 Intramuscular   Manufacturer: [place]   Lot: [id number]   [place]: [id number]

## 2021-02-11 ENCOUNTER — Other Ambulatory Visit (HOSPITAL_COMMUNITY): Payer: Self-pay | Admitting: Psychiatry

## 2021-02-11 DIAGNOSIS — F4010 Social phobia, unspecified: Secondary | ICD-10-CM

## 2021-02-11 DIAGNOSIS — F411 Generalized anxiety disorder: Secondary | ICD-10-CM

## 2021-02-14 ENCOUNTER — Telehealth (INDEPENDENT_AMBULATORY_CARE_PROVIDER_SITE_OTHER): Payer: 59 | Admitting: Psychiatry

## 2021-02-14 ENCOUNTER — Other Ambulatory Visit: Payer: Self-pay

## 2021-02-14 ENCOUNTER — Encounter (HOSPITAL_COMMUNITY): Payer: Self-pay | Admitting: Psychiatry

## 2021-02-14 VITALS — Wt 160.0 lb

## 2021-02-14 DIAGNOSIS — F419 Anxiety disorder, unspecified: Secondary | ICD-10-CM | POA: Diagnosis not present

## 2021-02-14 DIAGNOSIS — F4010 Social phobia, unspecified: Secondary | ICD-10-CM | POA: Diagnosis not present

## 2021-02-14 MED ORDER — VENLAFAXINE HCL ER 75 MG PO CP24: 75.0000 mg | ORAL_CAPSULE | Freq: Every day | ORAL | 0 refills | Status: DC

## 2021-02-14 NOTE — Progress Notes (Signed)
Virtual Visit via Telephone Note  I connected with Charles Hayden on 02/14/21 at  4:00 PM EST by telephone and verified that I am speaking with the correct person using two identifiers.  Location: Patient: Home Provider: Home Office   I discussed the limitations, risks, security and privacy concerns of performing an evaluation and management service by telephone and the availability of in person appointments. I also discussed with the patient that there may be a patient responsible charge related to this service. The patient expressed understanding and agreed to proceed.   History of Present Illness: Patient is evaluated by phone session.  He is taking Effexor every day and he reported to help his anxiety.  He reported it helps his social anxiety also but he still have episodes of nervousness when he is in crowded places.  But most of the time he feel it is manageable.  He is working from home.  He is single and lives with his parents.  He is currently not in any relationship.  Denies any panic attack, nervousness, feeling of hopelessness.  He does not feel he needed therapy.  Recently he is out of his stimulant and sometimes he is sleeping more than usual.  He was getting stimulant from the neurology and he like to have refills to be continued.  He does not want to change the Effexor since it is working.  His appetite is okay.  His weight is unchanged from the past.  He denies drinking or using any illegal substances.    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 No h/oinpatient treatment,suicidal attempt, mania and psychosis.Saw Dr. Cheryln Manly for CBT. Try propanolol, BuSpar and Lexapro but stopped on his own. H/Oseizures when living in [place].  Psychiatric Specialty Exam: Physical Exam  Review of Systems  Weight 160 lb (72.6 kg).There is no height or weight on file to calculate BMI.  General Appearance: NA  Eye Contact:  NA  Speech:  Slow   Volume:  Normal  Mood:  Anxious  Affect:  NA  Thought Process:  Descriptions of Associations: Intact  Orientation:  Full (Time, Place, and Person)  Thought Content:  WDL  Suicidal Thoughts:  No  Homicidal Thoughts:  No  Memory:  Immediate;   Good Recent;   Good Remote;   Good  Judgement:  Fair  Insight:  Shallow  Psychomotor Activity:  NA  Concentration:  Concentration: Good and Attention Span: Good  Recall:  Good  Fund of Knowledge:  Good  Language:  Fair  Akathisia:  No  Handed:  Right  AIMS (if indicated):     Assets:  Communication Skills Desire for Improvement Housing Talents/Skills  ADL's:  Intact  Cognition:  WNL  Sleep:   more than usual      Assessment and Plan:   Follow Up Instructions: Anxiety.  Social anxiety disorder.  Patient is a stable on his current medication.  He like to continue Effexor 75 mg daily and reported no side effects.  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  Patient like to have his notes forward to his neurologist which will be do.  Recommended to call us back if is any question or any concern.  Follow-up in 3 months.   I discussed the assessment and treatment plan with the patient. The patient was provided an opportunity to ask questions and all were answered. The patient agreed with the plan and demonstrated an understanding  of the instructions.   The patient was advised to call back or seek an in-person evaluation if the symptoms worsen or if the condition fails to improve as anticipated.  I provided 11 minutes of non-face-to-face time during this encounter.   Kathlee Nations, MD

## 2021-04-17 ENCOUNTER — Encounter: Payer: Self-pay | Admitting: Neurology

## 2021-04-17 NOTE — Patient Instructions (Signed)
Below is our plan:  We will try modafinil. Take 1 tablet every day when you wake up. Please incorporate healthy lifestyle habits as discussed. I encourage you to wean caffeine intake.    Please make sure you are staying well hydrated. I recommend 50-60 ounces daily. Well balanced diet and regular exercise encouraged. Consistent sleep schedule with 6-8 hours recommended.   Please continue follow up with care team as directed.   Follow up in 3-4 months   You may receive a survey regarding today's visit. I encourage you to leave honest feed back as I do use this information to improve patient care. Thank you for seeing me today!      Hypersomnia Hypersomnia is a condition in which a person feels very tired during the day even though he or she gets plenty of sleep at night. A person with this condition may take naps during the day and may find it very difficult to wake up from sleep. Hypersomnia may affect a person's ability to think, concentrate, drive, or remember things. What are the causes? The cause of this condition may not be known. Possible causes include:  Certain medicines.  Sleep disorders, such as narcolepsy and sleep apnea.  Injury to the head, brain, or spinal cord.  Drug or alcohol use.  Gastroesophageal reflux disease (GERD).  Tumors.  Certain medical conditions, such as depression, diabetes, or an underactive thyroid gland (hypothyroidism). What are the signs or symptoms? The main symptoms of hypersomnia include:  Feeling very tired throughout the day, regardless of how much sleep you got the night before.  Having trouble waking up. Others may find it difficult to wake you up when you are sleeping.  Sleeping for longer and longer periods at a time.  Taking naps throughout the day. Other symptoms may include:  Feeling restless, anxious, or annoyed.  Lacking energy.  Having trouble with: ? Remembering. ? Speaking. ? Thinking.  Loss of  appetite.  Seeing, hearing, tasting, smelling, or feeling things that are not real (hallucinations). How is this diagnosed? This condition may be diagnosed based on:  Your symptoms and medical history.  Your sleeping habits. Your health care provider may ask you to write down your sleeping habits in a daily sleep log, along with any symptoms you have.  A series of tests that are done while you sleep (sleep study or polysomnogram).  A test that measures how quickly you can fall asleep during the day (daytime nap study or multiple sleep latency test). How is this treated? Treatment can help you manage your condition. Treatment may include:  Following a regular sleep routine.  Lifestyle changes, such as changing your eating habits, getting regular exercise, and avoiding alcohol or caffeinated beverages.  Taking medicines to make you more alert (stimulants) during the day.  Treating any underlying medical causes of hypersomnia. Follow these instructions at home: Sleep routine  Schedule the same bedtime and wake-up time each day.  Practice a relaxing bedtime routine. This may include reading, meditation, deep breathing, or taking a warm bath before going to sleep.  Get regular exercise each day. Avoid strenuous exercise in the evening hours.  Keep your sleep environment at a cooler temperature, darkened, and quiet.  Sleep with pillows and a mattress that are comfortable and supportive.  Schedule short 20-minute naps for when you feel sleepiest during the day.  Talk with your employer or teachers about your hypersomnia. If possible, adjust your schedule so that: ? You have a regular daytime work  schedule. ? You can take a scheduled nap during the day. ? You do not have to work or be active at night.  Do not eat a heavy meal for a few hours before bedtime. Eat your meals at about the same times every day.  Avoid drinking alcohol or caffeinated beverages.   Safety  Do not  drive or use heavy machinery if you are sleepy. Ask your health care provider if it is safe for you to drive.  Wear a life jacket when swimming or spending time near water.   General instructions  Take supplements and over-the-counter and prescription medicines only as told by your health care provider.  Keep a sleep log that will help your doctor manage your condition. This may include information about: ? What time you go to bed each night. ? How often you wake up at night. ? How many hours you sleep at night. ? How often and for how long you nap during the day. ? Any observations from others, such as leg movements during sleep, sleep walking, or snoring.  Keep all follow-up visits as told by your health care provider. This is important. Contact a health care provider if:  You have new symptoms.  Your symptoms get worse. Get help right away if:  You have serious thoughts about hurting yourself or someone else. If you ever feel like you may hurt yourself or others, or have thoughts about taking your own life, get help right away. You can go to your nearest emergency department or call:  Your local emergency services (911 in the [place]  A suicide crisis helpline, such as the [place] at (513)742-5085. This is open 24 hours a day. Summary  Hypersomnia refers to a condition in which you feel very tired during the day even though you get plenty of sleep at night.  A person with this condition may take naps during the day and may find it very difficult to wake up from sleep.  Hypersomnia may affect a person's ability to think, concentrate, drive, or remember things.  Treatment, such as following a regular sleep routine and making some lifestyle changes, can help you manage your condition. This information is not intended to replace advice given to you by your health care provider. Make sure you discuss any questions you have with your health care  provider. Document Revised: 10/25/2020 Document Reviewed: 10/25/2020 Elsevier Patient Education  2021 [employer].

## 2021-04-17 NOTE — Progress Notes (Signed)
Chief Complaint  Patient presents with  . Follow-up    RM 2  alone Pt is well, recently hasn't had medication so fatigue has increased slightly. When he was on medication, it started to become less affective than before.      HISTORY OF PRESENT ILLNESS: 04/18/21 ALL:  Charles [place]den is a 25 y.o. male here today for follow up for excessive daytime sleepiness. He continued armodafinil 217m daily for about 6 months. He did not return for follow up and refills were not requested. He feels it helped improve EDS about 25%. He feels mood is stable. He is followed regularly by psychiatry. He continues venlafaxine and hydroxyine.    HISTORY (copied from Dr Dohmeier's previous note)  HTherman Hayden 25y.o. year old Asian male patientseen here upon referralon 04/26/2020 ,  The patient reports that he is still mostly working from home, and has had some trouble with staying alert at work due to an overwhelming feeling of sleepiness. He underwent weaning off Effexor, before he was tested by a baseline polysomnography and then an MSLT to follow.  He had very mild obstructive sleep apnea but is not a sufficient explanation for his degree of sleepiness normal EKG, his AHI was 5.9.  Sleep efficiency was 88.6% most important is his REM sleep latency he did have 27.3% REM sleep during the night and his REM latency was over 50 minutes.  MSLT followed with the first nap at 7:37 AM he fell asleep within a 12 minutes latency second nap he fell asleep with a 10-minute latency and then with an 8 minutes into sleep he did have his first REM sleep onset.  The third nap had a latency of only 6 minutes the fourth of 8.5 minutes in the 50s at 5 minutes.  Overall this is clearly a pattern of severe hypersomnia but with a single REM sleep onset we are still not able to identify this clearly as narcolepsy.  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    REVIEW OF SYSTEMS:  Out of a complete 14 system review of symptoms, the patient complains only of the following symptoms, depression, anxiety, fatigue, excessive sleepiness and all other reviewed systems are negative.  ESS: 15 FSS: 55   ALLERGIES: Allergies  Allergen Reactions  . Food     Tested via allergist, allergic peanuts...given Epi pen 04/23/11  . Pollen Extract     Intradermal testing, 04/23/11     HOME MEDICATIONS: Outpatient Medications Prior to Visit  Medication Sig Dispense Refill  . caffeine 200 MG TABS tablet Take 200 mg by mouth every 6 (six) hours as needed.    . hydrOXYzine (ATARAX/VISTARIL) 10 MG tablet TAKE 1 TO 2 TABLETS AS NEEDED FOR ANXIETY 40 tablet 0  . venlafaxine XR (EFFEXOR-XR) 75 MG 24 hr capsule Take 1 capsule (75 mg total) by mouth daily with breakfast. 90 capsule 0  . Armodafinil 200 MG TABS Take 200 mg by mouth in the morning. 30 tablet 5   No facility-administered medications prior to visit.     PAST MEDICAL HISTORY: Past Medical History:  Diagnosis Date  . Allergic rhinitis due to pollen   . Anxiety      PAST SURGICAL HISTORY: Past Surgical History:  Procedure Laterality Date  . APPENDECTOMY  03/31/2016   Dr. PDahlia Byes . LAPAROSCOPIC APPENDECTOMY N/A 03/31/2016   Procedure: APPENDECTOMY LAPAROSCOPIC;  Surgeon: DJules Husbands MD;  [facility];  Service: General;  Laterality: N/A;     FAMILY HISTORY: Family History  Problem Relation Age of Onset  . Alzheimer's disease Maternal Grandfather   . Heart disease Neg Hx   . Diabetes Neg Hx      SOCIAL HISTORY: Social History   Socioeconomic History  . Marital status: Single    Spouse name: Not on file  . Number of children: 0  . Years of education: Not on file  . Highest education level: Not on file  Occupational History  . Not on file  Tobacco Use  . Smoking status: Never Smoker  . Smokeless tobacco: Never Used  Vaping Use  . Vaping Use: Never used  Substance and Sexual Activity  . Alcohol  use: No  . Drug use: No  . Sexual activity: Not Currently  Other Topics Concern  . Not on file  Social History Narrative   Attended [employer] for [employer]. Now going to [employer] for [employer]. Lives in [place] with his parents. He has 1/2 brother in [place]. Pt used to work with is [employer] but now is only going to school.    Social Determinants of Health   Financial Resource Strain: Not on file  Food Insecurity: Not on file  Transportation Needs: Not on file  Physical Activity: Not on file  Stress: Not on file  Social Connections: Not on file  Intimate Partner Violence: Not on file      PHYSICAL EXAM  Vitals:   04/18/21 1001  BP: 120/80  Pulse: 85  Weight: 176 lb (79.8 kg)  Height: 5' 7"  (1.702 m)   Body mass index is 27.57 kg/m.   Generalized: Well developed, in no acute distress  Cardiology: normal rate and rhythm, no murmur auscultated  Respiratory: clear to auscultation bilaterally    Neurological examination  Mentation: Alert oriented to time, place, history taking. Follows all commands speech and language fluent Cranial nerve II-XII: Pupils were equal round reactive to light. Extraocular movements were full, visual field were full on confrontational test. Facial sensation and strength were normal. Head turning and shoulder shrug  were normal and symmetric. Motor: The motor testing reveals 5 over 5 strength of all 4 extremities. Good symmetric motor tone is noted throughout.  Gait and station: Gait is normal.     DIAGNOSTIC DATA (LABS, IMAGING, TESTING) - I reviewed patient records, labs, notes, testing and imaging myself where available.  Lab Results  Component Value Date   WBC 4.6 03/01/2018   HGB 14.4 03/01/2018   HCT 42.2 03/01/2018   MCV 86 03/01/2018   PLT 274 03/01/2018      Component Value Date/Time   NA 141 03/01/2018 1100   K 4.5 03/01/2018 1100   CL 101 03/01/2018 1100   CO2 24 03/01/2018 1100   GLUCOSE 83 03/01/2018  1100   GLUCOSE 127 (H) 03/31/2016 0625   BUN 14 03/01/2018 1100   CREATININE 0.85 03/01/2018 1100   CALCIUM 9.7 03/01/2018 1100   PROT 7.9 03/01/2018 1100   ALBUMIN 4.8 03/01/2018 1100   AST 31 03/01/2018 1100   ALT 35 03/01/2018 1100   ALKPHOS 74 03/01/2018 1100   BILITOT 0.6 03/01/2018 1100   GFRNONAA 124 03/01/2018 1100   GFRAA 143 03/01/2018 1100   No results found for: CHOL, HDL, LDLCALC, LDLDIRECT, TRIG, CHOLHDL Lab Results  Component Value Date   HGBA1C 5.6 03/01/2018   No results found for: [id number] Lab Results  Component Value Date  TSH 1.560 03/01/2018    No flowsheet data found.   No flowsheet data found.   ASSESSMENT AND PLAN  25 y.o. year old male  has a past medical history of Allergic rhinitis due to pollen and Anxiety. here with    Excessive daytime sleepiness  Mild obstructive sleep apnea  Evon continues to experience EDS. He feels that armodafinil helped a little but wishes to try a different stimulant. We will start modafinil 270m daily. May consider increasing dose to BID if well tolerated. He requests that prescription be sent to mail order pharmacy. We have discussed concerns of caffeine use. I have encouraged him to wean caffeine tablets. Healthy lifestyle habits encouraged. He will follow up closely with psychiatry. He will return to see uKoreain 3-4 months. He verbalizes understanding and agreement with this plan.    No orders of the defined types were placed in this encounter.    Meds ordered this encounter  Medications  . modafinil (PROVIGIL) 200 MG tablet    Sig: Take 1 tablet (200 mg total) by mouth daily.    Dispense:  90 tablet    Refill:  1    Order Specific Question:   Supervising Provider    Answer:   AMelvenia Beam[[id number]     I spent 20 minutes of face-to-face and non-face-to-face time with patient.  This included previsit chart review, lab review, study review, order entry, electronic health record documentation,  patient education.    ADebbora Presto MSN, FNP-C 04/18/2021, 11:07 AM  [facility] [address] [place] [phone]

## 2021-04-18 ENCOUNTER — Telehealth: Payer: Self-pay | Admitting: Neurology

## 2021-04-18 ENCOUNTER — Ambulatory Visit (INDEPENDENT_AMBULATORY_CARE_PROVIDER_SITE_OTHER): Payer: 59 | Admitting: Family Medicine

## 2021-04-18 ENCOUNTER — Encounter: Payer: Self-pay | Admitting: Family Medicine

## 2021-04-18 VITALS — BP 120/80 | HR 85 | Ht 67.0 in | Wt 176.0 lb

## 2021-04-18 DIAGNOSIS — G4733 Obstructive sleep apnea (adult) (pediatric): Secondary | ICD-10-CM | POA: Diagnosis not present

## 2021-04-18 DIAGNOSIS — G4719 Other hypersomnia: Secondary | ICD-10-CM

## 2021-04-18 MED ORDER — MODAFINIL 200 MG PO TABS: 200.0000 mg | ORAL_TABLET | Freq: Every day | ORAL | 1 refills | Status: DC

## 2021-04-18 NOTE — Telephone Encounter (Signed)
PA submitted via fax on paper form. Faxed to the number on form as well as sleep study and lab results sent. PA will be processed through [facility].  Will await response

## 2021-04-23 ENCOUNTER — Encounter: Payer: Self-pay | Admitting: Family Medicine

## 2021-05-02 NOTE — Telephone Encounter (Signed)
Approved: PA is already on file with expiration date of 04/18/2022.

## 2021-05-13 ENCOUNTER — Other Ambulatory Visit: Payer: Self-pay

## 2021-05-13 ENCOUNTER — Telehealth (INDEPENDENT_AMBULATORY_CARE_PROVIDER_SITE_OTHER): Payer: 59 | Admitting: Psychiatry

## 2021-05-13 ENCOUNTER — Encounter (HOSPITAL_COMMUNITY): Payer: Self-pay | Admitting: Psychiatry

## 2021-05-13 DIAGNOSIS — F419 Anxiety disorder, unspecified: Secondary | ICD-10-CM

## 2021-05-13 DIAGNOSIS — F4010 Social phobia, unspecified: Secondary | ICD-10-CM | POA: Diagnosis not present

## 2021-05-13 MED ORDER — VENLAFAXINE HCL ER 75 MG PO CP24: 75.0000 mg | ORAL_CAPSULE | Freq: Every day | ORAL | 0 refills | Status: DC

## 2021-05-13 MED ORDER — HYDROXYZINE HCL 10 MG PO TABS: ORAL_TABLET | ORAL | 0 refills | Status: DC

## 2021-05-13 NOTE — Progress Notes (Signed)
Virtual Visit via Telephone Note  I connected with Charles Hayden on 05/13/21 at  4:00 PM EDT by telephone and verified that I am speaking with the correct person using two identifiers.  Location: Patient: [place] Provider: Home Office   I discussed the limitations, risks, security and privacy concerns of performing an evaluation and management service by telephone and the availability of in person appointments. I also discussed with the patient that there may be a patient responsible charge related to this service. The patient expressed understanding and agreed to proceed.   History of Present Illness: Patient is evaluated by phone session.  Currently he is at [place] for his vacation.  Patient came with the family and he is enjoying the time.  He feels the current medicine is working and he has not taken hydroxyzine as frequent.  He feels his anxiety is under control but he still tried to avoid [place] places because he gets sometimes overwhelmed.  Recently he had a visit with a neurologist and he is taking a different stimulant.  He feels the modafinil is working better than previous long.  He is reporting no concerns or side effects with the medication.  His appetite is okay.  His energy level is good.  He has no issue at work.  His weight is unchanged from the past.  He denies any panic attack, crying spells or any feeling of hopelessness or worthlessness.  He is single and lives with his parents.  Currently he is not in any relationship.   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 No h/oinpatient treatment,suicidal attempt, mania and psychosis.Saw Dr. Cheryln Hayden for CBT. Try propanolol, BuSpar and Lexapro but stopped on his own. H/Oseizures when living in [place].    Psychiatric Specialty Exam: Physical Exam  Review of Systems  Weight 172 lb (78 kg).There is no height or weight on file to calculate BMI.  General Appearance: NA  Eye  Contact:  NA  Speech:  Slow  Volume:  Decreased  Mood:  Anxious  Affect:  NA  Thought Process:  Goal Directed  Orientation:  Full (Time, Place, and Person)  Thought Content:  Logical  Suicidal Thoughts:  No  Homicidal Thoughts:  No  Memory:  Immediate;   Good Recent;   Good Remote;   Good  Judgement:  Good  Insight:  Present  Psychomotor Activity:  NA  Concentration:  Concentration: Fair and Attention Span: Fair  Recall:  Good  Fund of Knowledge:  Good  Language:  Good  Akathisia:  No  Handed:  Right  AIMS (if indicated):     Assets:  Communication Skills Desire for Improvement Housing Social Support Talents/Skills Transportation  ADL's:  Intact  Cognition:  WNL  Sleep:   ok      Assessment and Plan: Anxiety, social anxiety disorder.  Patient is stable on his current medication.  He rarely takes hydroxyzine but does like to have a new prescription in case he needed.  I will continue Effexor 75 mg which he takes every day.  Discussed medication side effects and benefits.  I reviewed notes from neurology.  He has no blood work in more than 2 years.  We will order CBC, CMP, hemoglobin A1c.  Recommended to call us back if is any question or any concern.  Follow-up in 3 months.  Follow Up Instructions:    I discussed the assessment and treatment plan with the patient. The patient was provided an opportunity to ask questions and  all were answered. The patient agreed with the plan and demonstrated an understanding of the instructions.   The patient was advised to call back or seek an in-person evaluation if the symptoms worsen or if the condition fails to improve as anticipated.  I provided 13 minutes of non-face-to-face time during this encounter.   Kathlee Nations, MD

## 2021-05-14 ENCOUNTER — Other Ambulatory Visit (HOSPITAL_COMMUNITY): Payer: Self-pay

## 2021-05-14 DIAGNOSIS — Z79899 Other long term (current) drug therapy: Secondary | ICD-10-CM

## 2021-07-25 ENCOUNTER — Encounter: Payer: Self-pay | Admitting: Family Medicine

## 2021-07-25 ENCOUNTER — Ambulatory Visit: Payer: 59 | Admitting: Family Medicine

## 2021-07-25 NOTE — Progress Notes (Deleted)
No chief complaint on file.    HISTORY OF PRESENT ILLNESS: 07/25/21 ALL:  Dina returns for follow up. We switched him from armodafinil to modafinil 247m daily at last visit 03/2021 for continued EDS.   04/18/2021 ALL: HLajuane Leathamis a 25y.o. male here today for follow up for excessive daytime sleepiness. He continued armodafinil 2048mdaily for about 6 months. He did not return for follow up and refills were not requested. He feels it helped improve EDS about 25%. He feels mood is stable. He is followed regularly by psychiatry. He continues venlafaxine and hydroxyine.    HISTORY (copied from Dr Dohmeier's previous note)  HaNormal Recinoss a [age].o. year old Asian male patient seen here upon referral on 04/26/2020 ,   The patient reports that he is still mostly working from home, and has had some trouble with staying alert at work due to an overwhelming feeling of sleepiness. He underwent weaning off Effexor, before he was tested by a baseline polysomnography and then an MSLT to follow.  He had very mild obstructive sleep apnea but is not a sufficient explanation for his degree of sleepiness normal EKG, his AHI was 5.9.  Sleep efficiency was 88.6% most important is his REM sleep latency he did have 27.3% REM sleep during the night and his REM latency was over 50 minutes.  MSLT followed with the first nap at 7:37 AM he fell asleep within a 12 minutes latency second nap he fell asleep with a 10-minute latency and then with an 8 minutes into sleep he did have his first REM sleep onset.  The third nap had a latency of only 6 minutes the fourth of 8.5 minutes in the 50s at 5 minutes.  Overall this is clearly a pattern of severe hypersomnia but with a single REM sleep onset we are still not able to identify this clearly as narcolepsy.  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    REVIEW OF SYSTEMS: Out of a complete 14 system review of symptoms, the  patient complains only of the following symptoms, depression, anxiety, fatigue, excessive sleepiness and all other reviewed systems are negative.  ESS: 15 FSS: 55   ALLERGIES: Allergies  Allergen Reactions   Food     Tested via allergist, allergic peanuts...given Epi pen 04/23/11   Pollen Extract     Intradermal testing, 04/23/11     HOME MEDICATIONS: Outpatient Medications Prior to Visit  Medication Sig Dispense Refill   caffeine 200 MG TABS tablet Take 200 mg by mouth every 6 (six) hours as needed.     hydrOXYzine (ATARAX/VISTARIL) 10 MG tablet TAKE 1 TO 2 TABLETS AS NEEDED FOR ANXIETY 30 tablet 0   modafinil (PROVIGIL) 200 MG tablet Take 1 tablet (200 mg total) by mouth daily. 90 tablet 1   venlafaxine XR (EFFEXOR-XR) 75 MG 24 hr capsule Take 1 capsule (75 mg total) by mouth daily with breakfast. 90 capsule 0   No facility-administered medications prior to visit.     PAST MEDICAL HISTORY: Past Medical History:  Diagnosis Date   Allergic rhinitis due to pollen    Anxiety      PAST SURGICAL HISTORY: Past Surgical History:  Procedure Laterality Date   APPENDECTOMY  03/31/2016   Dr. PaDahlia Byes LAPAROSCOPIC APPENDECTOMY N/A 03/31/2016   Procedure: APPENDECTOMY LAPAROSCOPIC;  Surgeon: DiJules HusbandsMD;  Location: [facility] ORS;  Service: General;  Laterality: N/A;  FAMILY HISTORY: Family History  Problem Relation Age of Onset   Alzheimer's disease Maternal Grandfather    Heart disease Neg Hx    Diabetes Neg Hx      SOCIAL HISTORY: Social History   Socioeconomic History   Marital status: Single    Spouse name: Not on file   Number of children: 0   Years of education: Not on file   Highest education level: Not on file  Occupational History   Not on file  Tobacco Use   Smoking status: Never   Smokeless tobacco: Never  Vaping Use   Vaping Use: Never used  Substance and Sexual Activity   Alcohol use: No   Drug use: No   Sexual activity: Not Currently  Other  Topics Concern   Not on file  Social History Narrative   Attended [employer] for [employer]. Now going to [employer] for [employer]. Lives in [place] with his parents. He has 1/2 brother in [place]. Pt used to work with is [employer] but now is only going to school.    Social Determinants of Health   Financial Resource Strain: Not on file  Food Insecurity: Not on file  Transportation Needs: Not on file  Physical Activity: Not on file  Stress: Not on file  Social Connections: Not on file  Intimate Partner Violence: Not on file      PHYSICAL EXAM  There were no vitals filed for this visit.  There is no height or weight on file to calculate BMI.   Generalized: Well developed, in no acute distress  Cardiology: normal rate and rhythm, no murmur auscultated  Respiratory: clear to auscultation bilaterally    Neurological examination  Mentation: Alert oriented to time, place, history taking. Follows all commands speech and language fluent Cranial nerve II-XII: Pupils were equal round reactive to light. Extraocular movements were full, visual field were full on confrontational test. Facial sensation and strength were normal. Head turning and shoulder shrug  were normal and symmetric. Motor: The motor testing reveals 5 over 5 strength of all 4 extremities. Good symmetric motor tone is noted throughout.  Gait and station: Gait is normal.     DIAGNOSTIC DATA (LABS, IMAGING, TESTING) - I reviewed patient records, labs, notes, testing and imaging myself where available.  Lab Results  Component Value Date   WBC 4.6 03/01/2018   HGB 14.4 03/01/2018   HCT 42.2 03/01/2018   MCV 86 03/01/2018   PLT 274 03/01/2018      Component Value Date/Time   NA 141 03/01/2018 1100   K 4.5 03/01/2018 1100   CL 101 03/01/2018 1100   CO2 24 03/01/2018 1100   GLUCOSE 83 03/01/2018 1100   GLUCOSE 127 (H) 03/31/2016 0625   BUN 14 03/01/2018 1100   CREATININE 0.85 03/01/2018 1100    CALCIUM 9.7 03/01/2018 1100   PROT 7.9 03/01/2018 1100   ALBUMIN 4.8 03/01/2018 1100   AST 31 03/01/2018 1100   ALT 35 03/01/2018 1100   ALKPHOS 74 03/01/2018 1100   BILITOT 0.6 03/01/2018 1100   GFRNONAA 124 03/01/2018 1100   GFRAA 143 03/01/2018 1100   No results found for: CHOL, HDL, LDLCALC, LDLDIRECT, TRIG, CHOLHDL Lab Results  Component Value Date   HGBA1C 5.6 03/01/2018   No results found for: [id number] Lab Results  Component Value Date   TSH 1.560 03/01/2018    No flowsheet data found.   No flowsheet data found.   ASSESSMENT AND PLAN  25 y.o.  year old male  has a past medical history of Allergic rhinitis due to pollen and Anxiety. here with    Excessive daytime sleepiness  Sebastien continues to experience EDS. He feels that armodafinil helped a little but wishes to try a different stimulant. We will start modafinil 215m daily. May consider increasing dose to BID if well tolerated. He requests that prescription be sent to mail order pharmacy. We have discussed concerns of caffeine use. I have encouraged him to wean caffeine tablets. Healthy lifestyle habits encouraged. He will follow up closely with psychiatry. He will return to see uKoreain 3-4 months. He verbalizes understanding and agreement with this plan.    No orders of the defined types were placed in this encounter.    No orders of the defined types were placed in this encounter.    ADebbora Presto MSN, FNP-C 07/25/2021, 8:27 AM  [facility] [address] [place] [phone]

## 2021-08-12 ENCOUNTER — Other Ambulatory Visit (HOSPITAL_COMMUNITY): Payer: Self-pay | Admitting: Psychiatry

## 2021-08-12 DIAGNOSIS — F4010 Social phobia, unspecified: Secondary | ICD-10-CM

## 2021-08-12 DIAGNOSIS — F419 Anxiety disorder, unspecified: Secondary | ICD-10-CM

## 2021-08-14 ENCOUNTER — Encounter (HOSPITAL_COMMUNITY): Payer: Self-pay | Admitting: Psychiatry

## 2021-08-14 ENCOUNTER — Other Ambulatory Visit: Payer: Self-pay

## 2021-08-14 ENCOUNTER — Telehealth (INDEPENDENT_AMBULATORY_CARE_PROVIDER_SITE_OTHER): Payer: 59 | Admitting: Psychiatry

## 2021-08-14 DIAGNOSIS — F419 Anxiety disorder, unspecified: Secondary | ICD-10-CM

## 2021-08-14 DIAGNOSIS — F4010 Social phobia, unspecified: Secondary | ICD-10-CM | POA: Diagnosis not present

## 2021-08-14 MED ORDER — VENLAFAXINE HCL ER 75 MG PO CP24: 75.0000 mg | ORAL_CAPSULE | Freq: Every day | ORAL | 0 refills | Status: AC

## 2021-08-14 NOTE — Progress Notes (Signed)
Virtual Visit via Telephone Note  I connected with Charles Hayden on 08/14/21 at  3:40 PM EDT by telephone and verified that I am speaking with the correct person using two identifiers.  Location: Patient: Home Provider: Home Office   I discussed the limitations, risks, security and privacy concerns of performing an evaluation and management service by telephone and the availability of in person appointments. I also discussed with the patient that there may be a patient responsible charge related to this service. The patient expressed understanding and agreed to proceed.   History of Present Illness: Patient is evaluated by phone session.  He admitted lately having more anxiety because his Team Lead left and now his supervisor asking him to do more job which is not his responsibility.  He feels anxious because some of the work that his supervisor asking he does not know anything about it.  He is now looking for another job.  He also stopped taking the modafinil because he felt it is not working as much.  Though he denies any excessive sleep during the day but he does sleep 8 hours at night.  He is taking caffeine tablets.  He is not taking hydroxyzine which he used to take when he is very nervous and anxious or having any panic attack or in public places.  We have recommended labs but he is forgot to do the blood work and has not seen his physician in a while.  Currently patient is not in any relationship.  He lives with his parents.  His appetite is okay and his weight is stable.  He denies any crying spells or any feeling of hopelessness or worthlessness.  He wants to continue his venlafaxine which is helping his social anxiety and nervousness.  Past Psychiatric History:  H/O social anxiety and possible autistic spectrum disorder.  No h/o inpatient treatment, suicidal attempt, mania and psychosis. Saw Dr. Cheryln Manly for CBT.  Try propanolol, BuSpar and Lexapro but stopped on his own.  H/O seizures when  living in [place].    Psychiatric Specialty Exam: Physical Exam  Review of Systems  Weight 173 lb (78.5 kg).There is no height or weight on file to calculate BMI.  General Appearance: NA  Eye Contact:  NA  Speech:  Slow  Volume:  Decreased  Mood:  Anxious  Affect:  NA  Thought Process:  Goal Directed  Orientation:  Full (Time, Place, and Person)  Thought Content:  Logical  Suicidal Thoughts:  No  Homicidal Thoughts:  No  Memory:  Immediate;   Good Recent;   Good Remote;   Good  Judgement:  Intact  Insight:  Present  Psychomotor Activity:  NA  Concentration:  Concentration: Fair and Attention Span: Fair  Recall:  [place] of Knowledge:  Good  Language:  Good  Akathisia:  No  Handed:  Right  AIMS (if indicated):     Assets:  Communication Skills Desire for Improvement Housing Social Support Talents/Skills  ADL's:  Intact  Cognition:  WNL  Sleep:   7-8 hrs      Assessment and Plan: Anxiety.  Social anxiety disorder.  Patient taking Effexor 75 mg daily which is helping his anxiety.  He is not taking hydroxyzine but admitted more anxious as looking for a new job.  He has sleep contributing and if needed he will take it.  Reinforced to have blood work as patient has no blood work for more than 2 years.  We have ordered a CBC, CMP,  hemoglobin A1c on the last visit and patient promised to do the blood work before his next appointment.  Discussed medication side effects and benefits.  Recommended to call us back if is any question or any concern.  Follow-up in 3 months.  Follow Up Instructions:    I discussed the assessment and treatment plan with the patient. The patient was provided an opportunity to ask questions and all were answered. The patient agreed with the plan and demonstrated an understanding of the instructions.   The patient was advised to call back or seek an in-person evaluation if the symptoms worsen or if the condition fails to improve as anticipated.  I  provided 18 minutes of non-face-to-face time during this encounter.   Kathlee Nations, MD

## 2021-11-11 ENCOUNTER — Other Ambulatory Visit (HOSPITAL_COMMUNITY): Payer: Self-pay | Admitting: Psychiatry

## 2021-11-11 DIAGNOSIS — F4010 Social phobia, unspecified: Secondary | ICD-10-CM

## 2021-11-11 DIAGNOSIS — F419 Anxiety disorder, unspecified: Secondary | ICD-10-CM

## 2021-11-14 ENCOUNTER — Telehealth (HOSPITAL_COMMUNITY): Payer: 59 | Admitting: Psychiatry

## 2021-11-14 ENCOUNTER — Other Ambulatory Visit: Payer: Self-pay

## 2021-12-31 ENCOUNTER — Other Ambulatory Visit (HOSPITAL_COMMUNITY): Payer: Self-pay | Admitting: Psychiatry

## 2021-12-31 DIAGNOSIS — F4010 Social phobia, unspecified: Secondary | ICD-10-CM

## 2021-12-31 DIAGNOSIS — F419 Anxiety disorder, unspecified: Secondary | ICD-10-CM

## 2022-01-27 ENCOUNTER — Telehealth (HOSPITAL_COMMUNITY): Payer: Self-pay | Admitting: *Deleted

## 2022-01-27 NOTE — Telephone Encounter (Signed)
Writer spoke with pt regarding f/u appointment however pt says he does not need our services anymore. We will close out pt chart.

## 2022-08-29 ENCOUNTER — Telehealth: Payer: Self-pay | Admitting: Neurology

## 2022-08-29 ENCOUNTER — Encounter: Payer: Self-pay | Admitting: Neurology

## 2022-08-29 NOTE — Telephone Encounter (Signed)
Pt also sent mychart, see mychart message for details.

## 2022-08-29 NOTE — Telephone Encounter (Signed)
Pt called wanting to know if he can get a temporary note for accommodations at work until he sees provider on his Oct scheduled appt. Please advise.

## 2022-09-08 ENCOUNTER — Ambulatory Visit (INDEPENDENT_AMBULATORY_CARE_PROVIDER_SITE_OTHER): Payer: 59 | Admitting: Neurology

## 2022-09-08 ENCOUNTER — Encounter: Payer: Self-pay | Admitting: Neurology

## 2022-09-08 VITALS — BP 126/87 | HR 84 | Ht 66.0 in | Wt 174.2 lb

## 2022-09-08 DIAGNOSIS — G4710 Hypersomnia, unspecified: Secondary | ICD-10-CM

## 2022-09-08 DIAGNOSIS — G4719 Other hypersomnia: Secondary | ICD-10-CM

## 2022-09-08 DIAGNOSIS — G4711 Idiopathic hypersomnia with long sleep time: Secondary | ICD-10-CM

## 2022-09-08 MED ORDER — MODAFINIL 200 MG PO TABS: 300.0000 mg | ORAL_TABLET | Freq: Every day | ORAL | 3 refills | Status: DC

## 2022-09-08 NOTE — Patient Instructions (Addendum)
Solriamfetol Tablets What is this medication? SOLRIAMFETOL (sol ri AM fe tol) treats sleep disorders, such as narcolepsy and obstructive sleep apnea. It works by promoting wakefulness. This medicine may be used for other purposes; ask your health care provider or pharmacist if you have questions. COMMON BRAND NAME(S): SUNOSI What should I tell my care team before I take this medication? They need to know if you have any of these conditions: Bipolar disorder Diabetes Heart disease High blood pressure High cholesterol History of stroke Kidney disease Schizophrenia Substance use disorder An unusual or allergic reaction to solriamfetol, other medications, foods, dyes, or preservatives Pregnant or trying to get pregnant Breast-feeding How should I use this medication? Take this medication by mouth with water. Take it as directed on the prescription label at the same time every day. You can take it with or without food. If it upsets your stomach, take it with food. Keep taking it unless your care team tells you to stop. A special MedGuide will be given to you by the pharmacist with each prescription and refill. Be sure to read this information carefully each time. Talk to your care team about the use of this medication in children. Special care may be needed. Overdosage: If you think you have taken too much of this medicine contact a poison control center or emergency room at once. NOTE: This medicine is only for you. Do not share this medicine with others. What if I miss a dose? If you miss a dose, take it as soon as you can. However, avoid taking it within 9 hours of your planned bedtime, since you may find it harder to go to sleep. If it is almost time for your next dose, take only that dose. Do not take double or extra doses. What may interact with this medication? Do not take this medication with any of the following: MAOIs, such as Marplan, Nardil, and Parnate This  medication may also interact with the following: Certain medications for Parkinson disease, such as levodopa, pramipexole, ropinirole Medications that increase blood pressure or heart rate This list may not describe all possible interactions. Give your health care provider a list of all the medicines, herbs, non-prescription drugs, or dietary supplements you use. Also tell them if you smoke, drink alcohol, or use illegal drugs. Some items may interact with your medicine. What should I watch for while using this medication? Visit your care team for regular checks on your progress. Tell your care team if your symptoms do not start to get better or if they get worse. This medication has a risk of abuse and dependence. Your care team will check you for this while you take this medication. What side effects may I notice from receiving this medication? Side effects that you should report to your care team as soon as possible: Allergic reactions--skin rash, itching, hives, swelling of the face, lips, tongue, or throat Fast or irregular heartbeat Heart attack--pain or tightness in the chest, shoulders, arms, or jaw, nausea, shortness of breath, cold or clammy skin, feeling faint or lightheaded Increase in blood pressure Mood and behavior changes--anxiety, nervousness, confusion, hallucinations, irritability, hostility, thoughts of suicide or self-harm, worsening mood, feelings of depression Stroke--sudden numbness or weakness of the face, arm, or leg, trouble speaking, confusion, trouble walking, loss of balance or coordination, dizziness, severe headache, change in vision Side effects that usually do not require medical attention (report these to your care team if they continue  or are bothersome): Anxiety, nervousness Headache Loss of appetite Nausea Trouble sleeping This list may not describe all possible side effects. Call your doctor for medical advice about side effects. You may report side effects  to FDA at 1-800-FDA-1088. Where should I keep my medication? Keep out of the reach of children and pets. This medication can be abused. Keep it in a safe place to protect it from theft. Do not share it with anyone. It is only for you. Selling or giving away this medication is dangerous and against the law. Store at room temperature between 20 and 25 degrees C (68 and 77 degrees F). This medication may cause harm or death if it is taken by other adults, children, or pets. It is important to get rid of the medication as soon as you no longer need it or it is expired. You can do this in two ways: Take the medication to a medication take-back program. Check with your pharmacy or law enforcement to find a location. If you cannot return the medication, check the label or package insert to see if the medication should be thrown out in the garbage or flushed down the toilet. If you are not sure, ask your care team. If it is safe to put it in the trash, empty the medication out of the container. Mix the medication with cat litter, dirt, or other unwanted substance. Seal the mixture in a bag or container. Put it in the trash. NOTE: This sheet is a summary. It may not cover all possible information. If you have questions about this medicine, talk to your doctor, pharmacist, or health care provider.  2023 Elsevier/Gold Standard (2022-01-23 00:00:00)  To whom it may concern,   Charles Hayden, date of birth [birth year] has been treated in our sleep clinic for a condition that requires him to take therapeutic breaks .  This allows him to manage the symptoms, and he is best able to do this by working from home.  Please extend the patient's privilege to work from home for the foreseeable future due to his medical needs.  Sincerely, Larey Seat, MD   09-08-2022

## 2022-09-08 NOTE — Progress Notes (Addendum)
Chief Complaint  Patient presents with   Follow-up    RM 11, alone.  Last seen 04/18/21. No new sx.      HISTORY OF PRESENT ILLNESS: 09/08/22 : RV with Dr. Brett Fairy.  I have the pleasure of seeing Mr. Wessinger today, who reports that his excessive daytime sleepiness was only moderately responding to armodafinil.  He was changed to modafinil 200 mg once a day last year in April  22, but he still feels that after a brief" honeymoon " there has been a decrease in effect. What I can offer him today, is a higher dose of Provigil-modafinil. 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   We also have the idiopathic hypersomnia FDA approval for the use of Xyrem.   PS : He has not called [place] for refills and has meanwhile run out.   The patient did not endorse the fatigue severity scale today of his Epworth sleepiness score was endorsed at 13 /24 points, which is still elevated.   He also will need a temporary doctor's note for today as he wishes to be allowed to continue working from home.   His daytime sleepiness is managed better when he can take power naps- and he feels that this would not be possible in the office environment     ALL:  Grover Robinson is a 26 y.o. male here today for follow up for excessive daytime sleepiness. He continued armodafinil 271m daily for about 6 months. He did not return for follow up and refills were not requested. He feels it helped improve EDS about 25%. He feels mood is stable. He is followed regularly by psychiatry. He continues venlafaxine and hydroxyine.    HISTORY (copied from Dr Merdith Boyd's previous note)  Patient underwent PSG and MSLT in April 2021:IMPRESSION:    1. This multiple sleep latency test reveals a mean sleep latency  of 8.3 minutes with 1 sleep periods during which REM sleep was  recorded.  A total of 5 sleep periods in 5 nap opportunities were  recorded.    2.  This study was preceded by an overnight polysomnogram with a  total sleep time (TST) of 485.5 minutes.     HHajime Asfawis a 26y.o. year old Asian male patient seen here upon referral on 04/26/2020 ,   The patient reports that he is still mostly working from home, and has had some trouble with staying alert at work due to an overwhelming feeling of sleepiness. He underwent weaning off Effexor, before he was tested by a baseline polysomnography and then an MSLT to follow.  He had very mild obstructive sleep apnea but is not a sufficient explanation for his degree of sleepiness normal EKG, his AHI was 5.9.  Sleep efficiency was 88.6% most important is his REM sleep latency he did have 27.3% REM sleep during the night and his REM latency was over 50 minutes.  MSLT followed with the first nap at 7:37 AM he fell asleep within a 12 minutes latency second nap he fell asleep with a 10-minute latency and then with an 8 minutes into sleep he did have his first REM sleep onset.  The third nap had a latency of only 6 minutes the fourth of 8.5 minutes in the 50s at 5 minutes.  Overall this is clearly a pattern of severe hypersomnia but with a single REM sleep onset we are still  not able to identify this clearly as narcolepsy.  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    REVIEW OF SYSTEMS: Out of a complete 14 system review of symptoms, the patient complains only of the following symptoms, depression, anxiety, fatigue, excessive sleepiness and all other reviewed systems are negative.  How likely are you to doze in the following situations: 0 = not likely, 1 = slight chance, 2 = moderate chance, 3 = high chance  Sitting and Reading? Watching Television? Sitting inactive in a public place (theater or meeting)? Lying down in the afternoon when circumstances permit? Sitting and talking to someone? Sitting quietly after lunch without alcohol? In a car, while  stopped for a few minutes in traffic? As a passenger in a car for an hour without a break?  Total =   ESS: 13/ 24 points  FSS: 55/ 63 points    ALLERGIES: Allergies  Allergen Reactions   Food     Tested via allergist, allergic peanuts...given Epi pen 04/23/11   Pollen Extract     Intradermal testing, 04/23/11     HOME MEDICATIONS: Outpatient Medications Prior to Visit  Medication Sig Dispense Refill   caffeine 200 MG TABS tablet Take 200 mg by mouth every 6 (six) hours as needed.     hydrOXYzine (ATARAX/VISTARIL) 10 MG tablet TAKE 1 TO 2 TABLETS AS NEEDED FOR ANXIETY 30 tablet 0   venlafaxine XR (EFFEXOR-XR) 75 MG 24 hr capsule Take 1 capsule (75 mg total) by mouth daily with breakfast. 90 capsule 0   modafinil (PROVIGIL) 200 MG tablet Take 1 tablet (200 mg total) by mouth daily. 90 tablet 1   No facility-administered medications prior to visit.     PAST MEDICAL HISTORY: Past Medical History:  Diagnosis Date   Allergic rhinitis due to pollen    Anxiety      PAST SURGICAL HISTORY: Past Surgical History:  Procedure Laterality Date   APPENDECTOMY  03/31/2016   Dr. Dahlia Byes   LAPAROSCOPIC APPENDECTOMY N/A 03/31/2016   Procedure: APPENDECTOMY LAPAROSCOPIC;  Surgeon: Jules Husbands, MD;  Location: [facility] ORS;  Service: General;  Laterality: N/A;     FAMILY HISTORY: Family History  Problem Relation Age of Onset   Alzheimer's disease Maternal Grandfather    Heart disease Neg Hx    Diabetes Neg Hx      SOCIAL HISTORY: Social History   Socioeconomic History   Marital status: Single    Spouse name: Not on file   Number of children: 0   Years of education: Not on file   Highest education level: Not on file  Occupational History   Not on file  Tobacco Use   Smoking status: Never   Smokeless tobacco: Never  Vaping Use   Vaping Use: Never used  Substance and Sexual Activity   Alcohol use: No   Drug use: No   Sexual activity: Not Currently  Other Topics Concern    Not on file  Social History Narrative   Attended [employer] for [employer]. Now going to [employer] for [employer]. Lives in [place] with his parents. He has 1/2 brother in [place]. Pt used to work with is [employer] but now is only going to school.    Social Determinants of Health   Financial Resource Strain: Not on file  Food Insecurity: Not on file  Transportation Needs: Not on file  Physical Activity: Not on file  Stress: Not on file  Social Connections: Not on file  Intimate Partner Violence: Not on file      PHYSICAL EXAM  Vitals:   09/08/22 1529  BP: 126/87  Pulse: 84  Weight: 174 lb 3.2 oz (79 kg)  Height: _0  (1.676 m)   Body mass index is 28.12 kg/m.   Generalized: Well developed, in no acute distress  Cardiology: normal rate and rhythm, no murmur auscultated  Respiratory: clear to auscultation bilaterally    Neurological examination  Mentation: Alert oriented to time, place, history taking. Follows all commands speech and language fluent Cranial nerve : no loss of smell or taste.   Pupils were equal round reactive to light. Extraocular movements were full, visual field were full on confrontational test. Facial sensation and strength were normal. Head turning and shoulder shrug  were normal and symmetric. Motor:  5 /5 strength of all 4 extremities.  symmetric motor tone is noted throughout.  Gait and station: Gait is normal.     DIAGNOSTIC DATA (LABS, IMAGING, TESTING) - I reviewed patient records, labs, notes, testing and imaging myself where available.  Sleep study 07-26-2019, 4 -11-2020-   IMPRESSION:    1. This multiple sleep latency test reveals a mean sleep latency  of 8.3 minutes with 1 sleep periods during which REM sleep was  recorded.  A total of 5 sleep periods in 5 nap opportunities were  recorded.    2. This study was preceded by an overnight polysomnogram with a  total sleep time (TST) of 485.5 minutes.      DQA1*01:02  Positive   DQB1*06:02 Negative   Comment: Final Results:  DQA1*01:CCBFD,01:CUJJG  DQB1*05:CUSUC,05:CUWDU  Code Translation:   HLA narcolepsy panel : positive for one allel. 08-15-2019  Lab Results  Component Value Date   WBC 4.6 03/01/2018   HGB 14.4 03/01/2018   HCT 42.2 03/01/2018   MCV 86 03/01/2018   PLT 274 03/01/2018      Component Value Date/Time   NA 141 03/01/2018 1100   K 4.5 03/01/2018 1100   CL 101 03/01/2018 1100   CO2 24 03/01/2018 1100   GLUCOSE 83 03/01/2018 1100   GLUCOSE 127 (H) 03/31/2016 0625   BUN 14 03/01/2018 1100   CREATININE 0.85 03/01/2018 1100   CALCIUM 9.7 03/01/2018 1100   PROT 7.9 03/01/2018 1100   ALBUMIN 4.8 03/01/2018 1100   AST 31 03/01/2018 1100   ALT 35 03/01/2018 1100   ALKPHOS 74 03/01/2018 1100   BILITOT 0.6 03/01/2018 1100   GFRNONAA 124 03/01/2018 1100   GFRAA 143 03/01/2018 1100   No results found for: "CHOL", "HDL", "LDLCALC", "LDLDIRECT", "TRIG", "CHOLHDL" Lab Results  Component Value Date   HGBA1C 5.6 03/01/2018   No results found for: "VITAMINB12" Lab Results  Component Value Date   TSH 1.560 03/01/2018      ASSESSMENT AND PLAN  26 y.o. year old male  has a past medical history of Allergic rhinitis due to pollen and Anxiety. here with excessive daytime sleepiness and need for a letter to his employer.   EXCESSIVE daytime sleepiness/ IDIOPATHIC HYPERSOMNIA:   Mr. Khamani Fairley continues to experience EDS/ excessive daytime sleepiness. He feels that armodafinil helped a little but wishes to try a different stimulant.  Had started modafinil 2101m daily. Ran out last 2 months ago and needs refills. I will write for a higher dose.   He requests that prescription be sent to mail -order -pharmacy.   We have discussed concerns of caffeine use. I  have encouraged him to wean caffeine tablets.  Healthy lifestyle habits encouraged. He will follow up closely with psychiatry. He will return to see [place] in 6 months with NP Lomax .   He laso received information about SUNOSI> He verbalizes understanding and agreement with this plan.    I spent 20 minutes of face-to-face and non-face-to-face time with patient.  This included previsit chart review, lab review, study review, order entry, electronic health record documentation, patient education.   Larey Seat, MD 09/08/2022, 3:48 PM  [facility] [address], [place], [place] [address] [phone]

## 2022-09-16 ENCOUNTER — Telehealth: Payer: Self-pay | Admitting: *Deleted

## 2022-09-16 NOTE — Telephone Encounter (Signed)
Not able to reach pt, pt questionnaire form @ front desk for p/u

## 2022-09-23 ENCOUNTER — Telehealth: Payer: Self-pay | Admitting: Neurology

## 2022-09-23 DIAGNOSIS — G4711 Idiopathic hypersomnia with long sleep time: Secondary | ICD-10-CM | POA: Insufficient documentation

## 2022-09-23 NOTE — Telephone Encounter (Signed)
PA appeared to have been submitted some how. Received paperwork with questions from [facility] to completed. Question form completed along with office notes and sleep studies submitted to [facility] via fax. Confirmation received. Will await determination

## 2022-09-23 NOTE — Telephone Encounter (Signed)
Received fax from [facility] that PA approved 08/24/22-09/23/23. Case ID: [id number].

## 2022-10-09 ENCOUNTER — Ambulatory Visit: Payer: 59 | Admitting: Neurology

## 2022-12-17 ENCOUNTER — Encounter: Payer: Self-pay | Admitting: Neurology

## 2023-02-05 ENCOUNTER — Encounter: Payer: Self-pay | Admitting: Neurology

## 2023-02-05 ENCOUNTER — Ambulatory Visit: Payer: 59 | Admitting: Neurology

## 2023-03-09 ENCOUNTER — Ambulatory Visit: Payer: 59 | Admitting: Family Medicine

## 2023-03-16 ENCOUNTER — Encounter: Payer: Self-pay | Admitting: Neurology

## 2023-03-16 ENCOUNTER — Telehealth: Payer: Self-pay | Admitting: Neurology

## 2023-03-16 ENCOUNTER — Ambulatory Visit (INDEPENDENT_AMBULATORY_CARE_PROVIDER_SITE_OTHER): Payer: 59 | Admitting: Neurology

## 2023-03-16 VITALS — BP 121/84 | HR 92 | Ht 68.0 in | Wt 175.8 lb

## 2023-03-16 DIAGNOSIS — Z79899 Other long term (current) drug therapy: Secondary | ICD-10-CM | POA: Diagnosis not present

## 2023-03-16 DIAGNOSIS — G4719 Other hypersomnia: Secondary | ICD-10-CM

## 2023-03-16 DIAGNOSIS — G4711 Idiopathic hypersomnia with long sleep time: Secondary | ICD-10-CM | POA: Diagnosis not present

## 2023-03-16 MED ORDER — XYWAV 500 MG/ML PO SOLN: ORAL | 2 refills | Status: DC

## 2023-03-16 NOTE — Patient Instructions (Addendum)
As discussed, Charles Hayden has to be taken with very mindful caution: Taking Xyrem/ Charles Hayden correctly is key. This means, take it only when you are fully ready to fall asleep, while in bed and refrain from doing any other activities, even brushing  your teeth after taking your first dose. The second dose will be about 2-1/2-4 hours after his first dose. You can go to the bathroom before your 2nd dose. Take your first dose, when actually IN BED, ready to sleep. No sitting up in bed, NO reading, NO using the cell phone or computer, NO getting up to use the bathroom. Take care of everything BEFORE sleep time. Try NOT to skip the second dose as the Charles Hayden is not going to stay in your system long enough with only one dose. Do not drink alcohol with Charles Hayden. If you do drink Alcohol, you cannot take your Xyrem/ Charles Hayden doses that night.     Charles Hayden What is this medication? Charles Hayden (SOE dee um OX i bate) treats narcolepsy. It works by decreasing daytime sleepiness, which can help you sleep better at night. It also reduces the number of episodes of sudden muscle weakness (cataplexy). This medicine may be used for other purposes; ask your health care provider or pharmacist if you have questions. COMMON BRAND NAME(S): Charles Hayden What should I tell my care team before I take this medication? They need to know if you have any of these conditions: Depression Diet low in salt Frequently drink alcohol Heart disease High blood pressure History of substance use disorder Kidney disease Liver disease Lung or breathing disease, such as asthma or COPD Mental health conditions Sleep apnea Succinic semialdehyde dehydrogenase deficiency Suicidal thoughts, plans, or attempt An unusual or allergic reaction to Charles Hayden, other medications, foods, dyes, or preservatives Pregnant or trying to get pregnant Breastfeeding How should I use this medication? Take this medication by mouth.  This medication is taken at night as 2 separate doses. Take the first dose at bedtime, while you are in bed. Lie down right away after taking the dose and remain in bed. Take the second dose 2.5 to 4 hours after the first dose. You may need to set an alarm clock to wake up. Mix each dose with one-fourth cup of water in the provided containers. Take it on an empty stomach, at least 2 hours after food. Keep taking it unless your care team tells you to stop. A special MedGuide will be given to you by the pharmacist with each prescription and refill. Be sure to read this information carefully each time. This medication comes with INSTRUCTIONS FOR USE. Ask your pharmacist for directions on how to use this medication. Read the information carefully. Talk to your pharmacist or care team if you have questions. Talk to your care team about the use of this medication in children. While this medication may be prescribed for children as young as 7 years for selected conditions, precautions do apply. Overdosage: If you think you have taken too much of this medicine contact a poison control center or emergency room at once. NOTE: This medicine is only for you. Do not share this medicine with others. What if I miss a dose? If you miss a dose, skip it. Take your next dose at the normal time. Do not take extra or 2 doses at the same time to make up for the missed dose. What may interact with this medication? Do not take this medication with any of  the following: Alcohol Medications that help you fall asleep This medication may also interact with the following: Benzodiazepines, such as alprazolam, diazepam, or lorazepam Certain antihistamines Certain medications for depression, such as amitriptyline, fluoxetine, sertraline, or trazodone Certain medications for seizures such as divalproex Charles, phenobarbital, primidone, valproic acid Medications that cause drowsiness before a procedure, such as propofol Medications  that relax muscles Opioids for pain or cough Phenothiazines, such as chlorpromazine, prochlorperazine, thioridazine This list may not describe all possible interactions. Give your health care provider a list of all the medicines, herbs, non-prescription drugs, or dietary supplements you use. Also tell them if you smoke, drink alcohol, or use illegal drugs. Some items may interact with your medicine. What should I watch for while using this medication? Visit your care team for regular checks on your progress. Tell your care team if your symptoms do not start to get better or if they get worse. This medication has a risk of abuse and dependence. Your care team will check you for this while you take this medication. This medication may affect your coordination, reaction time, or judgment. Do not engage in activities that require mental alertness, such as driving or operating machinery, for at least 6 hours after taking this medication. You may fall asleep quickly after taking this medication. Remain in bed and do not attempt to sit or stand up. Drinking alcohol with this medication can increase the risk of these side effects. You may do unusual sleep behaviors or activities you do not remember the day after taking this medication. Activities include driving, making or eating food, talking on the phone, sexual activity, or sleep walking. Stop taking this medication and call your care team right away if you find out you have done activities like this. This medication may cause thoughts of suicide or depression. This includes sudden changes in mood, behaviors, or thoughts. These changes can happen at any time but are more common in the beginning of treatment or after a change in dose. Call your care team right away if you experience these thoughts or worsening depression. What side effects may I notice from receiving this medication? Side effects that you should report to your care team as soon as  possible: Allergic reactions--skin rash, itching, hives, swelling of the face, lips, tongue, or throat CNS depression--slow or shallow breathing, shortness of breath, feeling faint, dizziness, confusion, trouble staying awake Mood and behavior changes--anxiety, nervousness, confusion, hallucinations, irritability, hostility, thoughts of suicide or self-harm, worsening mood, feelings of depression Sleep apnea--loud snoring, gasping or choking during sleep, daytime sleepiness Sleepwalking Side effects that usually do not require medical attention (report to your care team if they continue or are bothersome): Bedwetting Dizziness Headache Loss of appetite Nausea Vomiting This list may not describe all possible side effects. Call your doctor for medical advice about side effects. You may report side effects to FDA at 1-800-FDA-1088. Where should I keep my medication? Keep out of the reach of children and pets. This medication can be abused. Keep it in a safe place to protect it from theft. Do not share it with anyone. It is only for you. Selling or giving away this medication is dangerous and against the law. Store at room temperature between 20 and 25 degrees C (68 and 77 degrees F). Keep this medication in the original container. Get rid of any unused medication after the expiration date. After mixing a dose of this medication with water, the medication should be taken within 24 hours. Get rid  of any unused and diluted medication. This medication may cause harm and death if it is taken by other adults, children, or pets. It is important to get rid of the medication as soon as you no longer need it or it is expired. You can do this in two ways: Take the medication to a medication take-back program. Check with your pharmacy or law enforcement to find a location. If you cannot return the medication, flush it down the toilet. NOTE: This sheet is a summary. It may not cover all possible information. If  you have questions about this medicine, talk to your doctor, pharmacist, or health care provider.  2023 Elsevier/Gold Standard (2022-05-08 00:00:00)    Sleep Clinic Follow up at [facility]:  Revisit Summary for the patient and his employer:  Charles Hayden is a 27 y.o.  male  who presents here on 03-16-2023. Here with persistent excessive daytime sleepiness and a need for a letter to his employer ; his goal is to work from home as much as possible. To manage his sleep/ his ap interruptions of work flow. He laso wants to avoid a commute of 30 minutes which he feels is potentially dangerous.  His employer offered a recliner chair to allow power naps. He would like to have this placed in a room not open to coworkers- a sleep pod. He appreciates the opportunity to be able to take power naps.   He is till in trial for other medications. DX : EXCESSIVE daytime sleepiness/ IDIOPATHIC HYPERSOMNIA:   Charles Hayden continues to experience EDS/ excessive daytime sleepiness. He feels that armodafinil helped a little but wishes to try a different stimulant. Had started modafinil 200mg  daily and then d/c it in order to resume high dose caffeine: Not on modafinil today. See ESS and FSS scores , the same as before .   We have discussed concerns of caffeine use have encouraged him to wean caffeine tablets. He takes still 600 mg caffeine daily.  He doesn't drink, he lives with parents.  Healthy lifestyle habits encouraged.   I like for him to try Xyrem/ Charles Hayden. A Titration table for medication will be printed, can be started after [facility] is back. My Nurse introduced him to the program. Once he reaches the goal dose, he wil revisit with NP Lomax or me and hopefully be safe to commute to work thereafter.  He will return to see [place] in 3-4 months with NP Lomax ,being  new on Charles Hayden.   Larey Seat, MD  03-16-2023

## 2023-03-16 NOTE — Progress Notes (Addendum)
Chief Complaint  Patient presents with   Follow-up    Patient in room #2 and alone. Patient states he is well and stable, no new concerns.     HISTORY OF PRESENT ILLNESS: 03/16/23 : Rv with CD,  Mr Gantt reports he is still fatigued and sleepy , modafinil and high dose caffine use have made it possible to get through the day, barely.  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 He has not been able to obtain [facility] and for this reason is now introduced to [employer] or once a night. He is diagnosed with IDIOPATHIC HYPERSOMNIA and I am not sure if once a day NA oxybate is  available for a non-narcoleptic individual.        09-08-2022: RV with Dr. Brett Fairy.  I have the pleasure of seeing Mr. Gorelick today, who reports that his excessive daytime sleepiness was only moderately responding to armodafinil.  He was changed to modafinil 200 mg once a day last year in April  22, but he still feels that after a brief" honeymoon " there has been a decrease in effect. What I can offer him today, is a higher dose of Provigil-modafinil. 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   We also have the idiopathic hypersomnia FDA approval for the use of Xyrem.   PS : He has not called [place] for refills and has meanwhile run out.   The patient did not endorse the fatigue severity scale today of his Epworth sleepiness score was endorsed at 13 /24 points, which is still elevated.   He also will need a temporary doctor's note for today as he wishes to be allowed to continue working from home.   His daytime sleepiness is managed better when he can take power naps- and he feels that this would not be possible in the office environment     ALL:  Caius Connelley is a 27 y.o. male here today for follow up for excessive daytime sleepiness. He continued armodafinil 200mg  daily for about  6 months. He did not return for follow up and refills were not requested. He feels it helped improve EDS about 25%. He feels mood is stable. He is followed regularly by psychiatry. He continues venlafaxine and hydroxyine.    HISTORY (copied from Dr Munachimso Rigdon's previous note)  Patient underwent PSG and MSLT in April 2021:IMPRESSION:    1. This multiple sleep latency test reveals a mean sleep latency  of 8.3 minutes with 1 sleep periods during which REM sleep was  recorded.  A total of 5 sleep periods in 5 nap opportunities were  recorded.    2. This study was preceded by an overnight polysomnogram with a  total sleep time (TST) of 485.5 minutes.     Kyah Rosser is a 27 y.o. year old Asian male patient seen here upon referral on 04/26/2020 ,   The patient reports that he is still mostly working from home, and has had some trouble with staying alert at work due to an overwhelming feeling of sleepiness. He underwent weaning off Effexor, before he was tested by a baseline polysomnography and then an MSLT to follow.  He had very mild obstructive sleep apnea but is not a sufficient explanation for his degree of sleepiness normal EKG, his AHI was 5.9.  Sleep efficiency was 88.6%  most important is his REM sleep latency he did have 27.3% REM sleep during the night and his REM latency was over 50 minutes.  MSLT followed with the first nap at 7:37 AM he fell asleep within a 12 minutes latency second nap he fell asleep with a 10-minute latency and then with an 8 minutes into sleep he did have his first REM sleep onset.  The third nap had a latency of only 6 minutes the fourth of 8.5 minutes in the 50s at 5 minutes.  Overall this is clearly a pattern of severe hypersomnia but with a single REM sleep onset we are still not able to identify this clearly as narcolepsy.  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    REVIEW OF SYSTEMS: Out of a complete 14 system  review of symptoms, the patient complains only of the following symptoms, depression, anxiety, fatigue, excessive sleepiness and all other reviewed systems are negative.  How likely are you to doze in the following situations: 0 = not likely, 1 = slight chance, 2 = moderate chance, 3 = high chance  Sitting and Reading? Watching Television? Sitting inactive in a public place (theater or meeting)? Lying down in the afternoon when circumstances permit? Sitting and talking to someone? Sitting quietly after lunch without alcohol? In a car, while stopped for a few minutes in traffic? As a passenger in a car for an hour without a break?  Total =   ESS: 14/ 24 points -  FSS: 51 / 63 points - he d/c modafinil.    ALLERGIES: Allergies  Allergen Reactions   Food     Tested via allergist, allergic peanuts...given Epi pen 04/23/11   Pollen Extract     Intradermal testing, 04/23/11     HOME MEDICATIONS: Outpatient Medications Prior to Visit  Medication Sig Dispense Refill   hydrOXYzine (ATARAX/VISTARIL) 10 MG tablet TAKE 1 TO 2 TABLETS AS NEEDED FOR ANXIETY 30 tablet 0   modafinil (PROVIGIL) 200 MG tablet Take 1.5 tablets (300 mg total) by mouth daily. 135 tablet 3   venlafaxine XR (EFFEXOR-XR) 75 MG 24 hr capsule Take 1 capsule (75 mg total) by mouth daily with breakfast. 90 capsule 0   No facility-administered medications prior to visit.     PAST MEDICAL HISTORY: Past Medical History:  Diagnosis Date   Allergic rhinitis due to pollen    Anxiety      PAST SURGICAL HISTORY: Past Surgical History:  Procedure Laterality Date   APPENDECTOMY  03/31/2016   Dr. Dahlia Byes   LAPAROSCOPIC APPENDECTOMY N/A 03/31/2016   Procedure: APPENDECTOMY LAPAROSCOPIC;  Surgeon: Jules Husbands, MD;  Location: [facility] ORS;  Service: General;  Laterality: N/A;     FAMILY HISTORY: Family History  Problem Relation Age of Onset   Alzheimer's disease Maternal Grandfather    Heart disease Neg Hx    Diabetes  Neg Hx      SOCIAL HISTORY: Social History   Socioeconomic History   Marital status: Single    Spouse name: Not on file   Number of children: 0   Years of education: Not on file   Highest education level: Not on file  Occupational History   Not on file  Tobacco Use   Smoking status: Never   Smokeless tobacco: Never  Vaping Use   Vaping Use: Never used  Substance and Sexual Activity   Alcohol use: No   Drug use: No   Sexual activity:  Not Currently  Other Topics Concern   Not on file  Social History Narrative   Attended [employer] for [employer]. Now going to [employer] for [employer]. Lives in [place] with his parents. He has 1/2 brother in [place]. Pt used to work with is [employer] but now is only going to school.    Social Determinants of Health   Financial Resource Strain: Not on file  Food Insecurity: Not on file  Transportation Needs: Not on file  Physical Activity: Not on file  Stress: Not on file  Social Connections: Not on file  Intimate Partner Violence: Not on file      PHYSICAL EXAM  Vitals:   03/16/23 1012  BP: 121/84  Pulse: 92  Weight: 175 lb 12.8 oz (79.7 kg)  Height: 5\' 8"  (1.727 m)   Body mass index is 26.73 kg/m.   Generalized: Well developed, in no acute distress  Cardiology: normal rate and rhythm, no murmur auscultated  Respiratory: clear to auscultation bilaterally    Neurological examination  Mentation: Alert oriented to time, place, history taking. Follows all commands speech and language fluent Cranial nerve : no loss of smell or taste.   Pupils were equal round reactive to light. Extraocular movements were full, visual field were full on confrontational test. Facial sensation and strength were normal. Head turning and shoulder shrug  were normal and symmetric. Motor:  5 /5 strength of all 4 extremities.  symmetric motor tone is noted throughout.  Gait and station: Gait is normal.     DIAGNOSTIC DATA (LABS,  IMAGING, TESTING) - I reviewed patient records, labs, notes, testing and imaging myself where available.  Sleep study 07-26-2019, 4 -11-2020-   IMPRESSION:    1. This multiple sleep latency test reveals a mean sleep latency  of 8.3 minutes with 1 sleep periods during which REM sleep was  recorded.  A total of 5 sleep periods in 5 nap opportunities were  recorded.    2. This study was preceded by an overnight polysomnogram with a  total sleep time (TST) of 485.5 minutes.      DQA1*01:02 Positive   DQB1*06:02 Negative   Comment: Final Results:  DQA1*01:CCBFD,01:CUJJG  DQB1*05:CUSUC,05:CUWDU  Code Translation:   HLA narcolepsy panel : positive for one allel. 08-15-2019  Lab Results  Component Value Date   WBC 4.6 03/01/2018   HGB 14.4 03/01/2018   HCT 42.2 03/01/2018   MCV 86 03/01/2018   PLT 274 03/01/2018      Component Value Date/Time   NA 141 03/01/2018 1100   K 4.5 03/01/2018 1100   CL 101 03/01/2018 1100   CO2 24 03/01/2018 1100   GLUCOSE 83 03/01/2018 1100   GLUCOSE 127 (H) 03/31/2016 0625   BUN 14 03/01/2018 1100   CREATININE 0.85 03/01/2018 1100   CALCIUM 9.7 03/01/2018 1100   PROT 7.9 03/01/2018 1100   ALBUMIN 4.8 03/01/2018 1100   AST 31 03/01/2018 1100   ALT 35 03/01/2018 1100   ALKPHOS 74 03/01/2018 1100   BILITOT 0.6 03/01/2018 1100   GFRNONAA 124 03/01/2018 1100   GFRAA 143 03/01/2018 1100   No results found for: "CHOL", "HDL", "LDLCALC", "LDLDIRECT", "TRIG", "CHOLHDL" Lab Results  Component Value Date   HGBA1C 5.6 03/01/2018   No results found for: "VITAMINB12" Lab Results  Component Value Date   TSH 1.560 03/01/2018      ASSESSMENT AND PLAN:   Mr Baruc Ivens is a 27 y.o.  male  has a past  medical history of Allergic rhinitis due to pollen and Anxiety,  who presents here on 03-16-2023. Here with persistent excessive daytime sleepiness and a need for a letter to his employer ; his goal is to work from home as much as possible. To manage his  sleep/ his nap interruptions of work flow. He laso wants to avoid a commute of 30 minutes which he feels is potentially dangerous.  His employer offered a recliner chair to allow power naps. He would like to have this placed in a room not open to coworkers- a sleep pod. He appreciates the opportunity to be able to take power naps.   He is till in trial for other medications.    DX : EXCESSIVE daytime sleepiness/ IDIOPATHIC HYPERSOMNIA:   Mr. Achillies Allegra continues to experience EDS/ excessive daytime sleepiness. He feels that armodafinil helped a little but wishes to try a different stimulant. Had started modafinil 200mg  daily and then d/c it in order to resume high dose caffeine: Not on modafinil today. See ESS and FSS scores , the same as before .   We have discussed concerns of caffeine use have encouraged him to wean caffeine tablets. He takes still 600 mg caffeine daily.  He doesn't drink, he lives with parents.  Healthy lifestyle habits encouraged. He will follow up closely with psychiatry.  I like for him to try Xyrem/ Xywav.   Titration will be printed, can be started after CMET is back.  He will return to see [place] in 3-4 months with NP [place] ,being  new on Xywav.   I spent 20 minutes of face-to-face and non-face-to-face time with patient.  This included previsit chart review, lab review, study review, order entry, electronic health record documentation, patient education.   Larey Seat, MD 03/16/2023, 10:37 AM  [facility] [address], [place], [place] [address] [phone]

## 2023-03-16 NOTE — Addendum Note (Signed)
Addended by: Larey Seat on: 03/16/2023 10:58 AM   Modules accepted: Orders

## 2023-03-16 NOTE — Telephone Encounter (Signed)
Pt discussed getting set up with Xywav. Start form compelted and signed and script signed by MD. Will send this information to xywav [facility] for the pt.

## 2023-03-17 ENCOUNTER — Ambulatory Visit: Payer: 59 | Admitting: Neurology

## 2023-03-17 ENCOUNTER — Encounter: Payer: Self-pay | Admitting: Neurology

## 2023-03-17 LAB — COMPREHENSIVE METABOLIC PANEL
ALT: 19 IU/L (ref 0–44)
AST: 24 IU/L (ref 0–40)
Albumin/Globulin Ratio: 1.6 (ref 1.2–2.2)
Albumin: 4.9 g/dL (ref 4.3–5.2)
Alkaline Phosphatase: 93 IU/L (ref 44–121)
BUN/Creatinine Ratio: 15 (ref 9–20)
BUN: 14 mg/dL (ref 6–20)
Bilirubin Total: 0.3 mg/dL (ref 0.0–1.2)
CO2: 24 mmol/L (ref 20–29)
Calcium: 10.2 mg/dL (ref 8.7–10.2)
Chloride: 100 mmol/L (ref 96–106)
Creatinine, Ser: 0.91 mg/dL (ref 0.76–1.27)
Globulin, Total: 3 g/dL (ref 1.5–4.5)
Glucose: 90 mg/dL (ref 70–99)
Potassium: 4.3 mmol/L (ref 3.5–5.2)
Sodium: 141 mmol/L (ref 134–144)
Total Protein: 7.9 g/dL (ref 6.0–8.5)
eGFR: 118 mL/min/{1.73_m2} (ref >59)

## 2023-03-18 NOTE — Telephone Encounter (Signed)
Arch form faxed and emailed

## 2023-03-19 ENCOUNTER — Telehealth: Payer: Self-pay | Admitting: Neurology

## 2023-03-19 NOTE — Telephone Encounter (Signed)
Completed PA for Xywav on [facility] KEY: [place] approved immediately  CaseId:[id number];Status:Approved;Review Type:Prior Auth;Coverage Start Date:02/17/2023;Coverage End Date:03/18/2024;. Authorization Expiration Date: March 18, 2024.

## 2023-03-19 NOTE — Telephone Encounter (Signed)
Med list faxed to the pharmacy

## 2023-03-19 NOTE — Telephone Encounter (Signed)
Briana @ [facility] is asking for a copy of pt's med list be faxed to them @ [phone] phone [phone]

## 2023-03-19 NOTE — Addendum Note (Signed)
Addended by: Darleen Crocker on: 03/19/2023 04:28 PM   Modules accepted: Orders

## 2023-03-23 ENCOUNTER — Telehealth: Payer: Self-pay | Admitting: Neurology

## 2023-03-23 ENCOUNTER — Ambulatory Visit: Payer: 59 | Admitting: Family Medicine

## 2023-03-23 ENCOUNTER — Telehealth: Payer: Self-pay | Admitting: Family Medicine

## 2023-03-23 ENCOUNTER — Encounter: Payer: Self-pay | Admitting: Neurology

## 2023-03-23 NOTE — Telephone Encounter (Signed)
I have updated the script to reflect the titration schedule. I have faxed the updated script to the pharmacy.  **If the [facility] pharmacy calls back please inform that the pt is new to the medication. He will start by taking  2.25 G Twice nightly X 7 days then increase to 3 G twice nightly X7 days then increase to 3.75 G twice nightly X 7 days and then increase to Goal dose of  4.5G twice nightly.  If at any point the patient is well treated at the lower doses instead of increasing to goal, they can let us know and we can send a updated script to reflect the maintenance dose for the patient.

## 2023-03-23 NOTE — Telephone Encounter (Signed)
LVM and sent mychart asking pt about appointment for 03/23/23. If pt calls back and he doesn't want to keep appointment for today, he can be rescheduled for four months with either Amy or Dr. Brett Fairy

## 2023-03-23 NOTE — Progress Notes (Deleted)
No chief complaint on file.    HISTORY OF PRESENT ILLNESS:  03/23/23 ALL:   03/16/23: Rv with CD Mr Payano reports he is still fatigued and sleepy , modafinil and high dose caffine use have made it possible to get through the day, barely.  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 He has not been able to obtain [facility] and for this reason is now introduced to [employer] or once a night. He is diagnosed with IDIOPATHIC HYPERSOMNIA and I am not sure if once a day NA oxybate is  available for a non-narcoleptic individual.    09/08/2022: RV with Dr. Brett Fairy.  I have the pleasure of seeing Mr. Geske today, who reports that his excessive daytime sleepiness was only moderately responding to armodafinil.  He was changed to modafinil 200 mg once a day last year in April  22, but he still feels that after a brief" honeymoon " there has been a decrease in effect. What I can offer him today, is a higher dose of Provigil-modafinil. 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   We also have the idiopathic hypersomnia FDA approval for the use of Xyrem.    PS : He has not called [place] for refills and has meanwhile run out.    The patient did not endorse the fatigue severity scale today of his Epworth sleepiness score was endorsed at 13 /24 points, which is still elevated.   He also will need a temporary doctor's note for today as he wishes to be allowed to continue working from home.   His daytime sleepiness is managed better when he can take power naps- and he feels that this would not be possible in the office environment  04/18/2021 ALL: Harvey Maxon is a 27 y.o. male here today for follow up for excessive daytime sleepiness. He continued armodafinil 200mg  daily for about 6 months. He did not return for follow up and refills were not requested. He feels it helped improve  EDS about 25%. He feels mood is stable. He is followed regularly by psychiatry. He continues venlafaxine and hydroxyine.   HISTORY (copied from Dr Dohmeier's previous note)  Poet Stute is a 27 y.o. year old Asian male patient seen here upon referral on 04/26/2020 ,   The patient reports that he is still mostly working from home, and has had some trouble with staying alert at work due to an overwhelming feeling of sleepiness. He underwent weaning off Effexor, before he was tested by a baseline polysomnography and then an MSLT to follow.  He had very mild obstructive sleep apnea but is not a sufficient explanation for his degree of sleepiness normal EKG, his AHI was 5.9.  Sleep efficiency was 88.6% most important is his REM sleep latency he did have 27.3% REM sleep during the night and his REM latency was over 50 minutes.  MSLT followed with the first nap at 7:37 AM he fell asleep within a 12 minutes latency second nap he fell asleep with a 10-minute latency and then with an 8 minutes into sleep he did have his first REM sleep onset.  The third nap had a latency of only 6 minutes the fourth of 8.5 minutes in the 50s at 5 minutes.  Overall this is clearly a pattern of severe hypersomnia but with a single REM  sleep onset we are still not able to identify this clearly as narcolepsy.  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    REVIEW OF SYSTEMS: Out of a complete 14 system review of symptoms, the patient complains only of the following symptoms, depression, anxiety, fatigue, excessive sleepiness and all other reviewed systems are negative.  ESS: 15 FSS: 55   ALLERGIES: Allergies  Allergen Reactions   Food     Tested via allergist, allergic peanuts...given Epi pen 04/23/11   Pollen Extract     Intradermal testing, 04/23/11     HOME MEDICATIONS: Outpatient Medications Prior to Visit  Medication Sig Dispense Refill   Ca, Mg, K, and Na Oxybates  (XYWAV) 500 MG/ML SOLN Titration table for the first 3 weeks is attached separately 300 mL 2   venlafaxine XR (EFFEXOR-XR) 75 MG 24 hr capsule Take 1 capsule (75 mg total) by mouth daily with breakfast. 90 capsule 0   No facility-administered medications prior to visit.     PAST MEDICAL HISTORY: Past Medical History:  Diagnosis Date   Allergic rhinitis due to pollen    Anxiety      PAST SURGICAL HISTORY: Past Surgical History:  Procedure Laterality Date   APPENDECTOMY  03/31/2016   Dr. Dahlia Byes   LAPAROSCOPIC APPENDECTOMY N/A 03/31/2016   Procedure: APPENDECTOMY LAPAROSCOPIC;  Surgeon: Jules Husbands, MD;  Location: [facility] ORS;  Service: General;  Laterality: N/A;     FAMILY HISTORY: Family History  Problem Relation Age of Onset   Alzheimer's disease Maternal Grandfather    Heart disease Neg Hx    Diabetes Neg Hx      SOCIAL HISTORY: Social History   Socioeconomic History   Marital status: Single    Spouse name: Not on file   Number of children: 0   Years of education: Not on file   Highest education level: Not on file  Occupational History   Not on file  Tobacco Use   Smoking status: Never   Smokeless tobacco: Never  Vaping Use   Vaping Use: Never used  Substance and Sexual Activity   Alcohol use: No   Drug use: No   Sexual activity: Not Currently  Other Topics Concern   Not on file  Social History Narrative   Attended  for [employer]. Now going to [employer] for [employer]. Lives in [place] with his parents. He has 1/2 brother in [place]. Pt used to work with is [employer] but now is only going to school.    Social Determinants of Health   Financial Resource Strain: Not on file  Food Insecurity: Not on file  Transportation Needs: Not on file  Physical Activity: Not on file  Stress: Not on file  Social Connections: Not on file  Intimate Partner Violence: Not on file      PHYSICAL EXAM  There were no vitals filed for this  visit.  There is no height or weight on file to calculate BMI.   Generalized: Well developed, in no acute distress  Cardiology: normal rate and rhythm, no murmur auscultated  Respiratory: clear to auscultation bilaterally    Neurological examination  Mentation: Alert oriented to time, place, history taking. Follows all commands speech and language fluent Cranial nerve II-XII: Pupils were equal round reactive to light. Extraocular movements were full, visual field were full on confrontational test. Facial sensation and strength were normal. Head turning and shoulder shrug  were normal and  symmetric. Motor: The motor testing reveals 5 over 5 strength of all 4 extremities. Good symmetric motor tone is noted throughout.  Gait and station: Gait is normal.     DIAGNOSTIC DATA (LABS, IMAGING, TESTING) - I reviewed patient records, labs, notes, testing and imaging myself where available.  Lab Results  Component Value Date   WBC 4.6 03/01/2018   HGB 14.4 03/01/2018   HCT 42.2 03/01/2018   MCV 86 03/01/2018   PLT 274 03/01/2018      Component Value Date/Time   NA 141 03/16/2023 1118   K 4.3 03/16/2023 1118   CL 100 03/16/2023 1118   CO2 24 03/16/2023 1118   GLUCOSE 90 03/16/2023 1118   GLUCOSE 127 (H) 03/31/2016 0625   BUN 14 03/16/2023 1118   CREATININE 0.91 03/16/2023 1118   CALCIUM 10.2 03/16/2023 1118   PROT 7.9 03/16/2023 1118   ALBUMIN 4.9 03/16/2023 1118   AST 24 03/16/2023 1118   ALT 19 03/16/2023 1118   ALKPHOS 93 03/16/2023 1118   BILITOT 0.3 03/16/2023 1118   GFRNONAA 124 03/01/2018 1100   GFRAA 143 03/01/2018 1100   No results found for: "CHOL", "HDL", "LDLCALC", "LDLDIRECT", "TRIG", "CHOLHDL" Lab Results  Component Value Date   HGBA1C 5.6 03/01/2018   No results found for: "VITAMINB12" Lab Results  Component Value Date   TSH 1.560 03/01/2018        No data to display               No data to display           ASSESSMENT AND PLAN  27  y.o. year old male  has a past medical history of Allergic rhinitis due to pollen and Anxiety. here with    No diagnosis found.  Berley continues to experience EDS. He feels that armodafinil helped a little but wishes to try a different stimulant. We will start modafinil 200mg  daily. May consider increasing dose to BID if well tolerated. He requests that prescription be sent to mail order pharmacy. We have discussed concerns of caffeine use. I have encouraged him to wean caffeine tablets. Healthy lifestyle habits encouraged. He will follow up closely with psychiatry. He will return to see [place] in 3-4 months. He verbalizes understanding and agreement with this plan.    No orders of the defined types were placed in this encounter.    No orders of the defined types were placed in this encounter.     I spent 20 minutes of face-to-face and non-face-to-face time with patient.  This included previsit chart review, lab review, study review, order entry, electronic health record documentation, patient education.    Debbora [place], MSN, FNP-C 03/23/2023, 8:03 AM  [facility] [address], [place], [place] [address] [phone]

## 2023-03-23 NOTE — Telephone Encounter (Addendum)
Pharmacy: [facility] on the form for the [facility] the # of nights for each titration step  Phone# [phone] option 3 then option 4

## 2023-03-31 NOTE — Telephone Encounter (Signed)
[facility] pharmacy called back. Stated she need to talk to nurse about pt sleep apnea and pt medication. She is requesting a call back from nurse. [phone] opt 3 then opt 4

## 2023-04-01 NOTE — Telephone Encounter (Signed)
The pharmacy wanted to confirm that its ok for patient to continue with taking the medication. Noted on our end

## 2023-04-01 NOTE — Telephone Encounter (Signed)
Called the [facility] pharmacy and spoke with [place].   Pt reports taking hydroxyzine 10 mg as needed for anxiety.

## 2023-05-07 ENCOUNTER — Ambulatory Visit: Payer: 59 | Admitting: Neurology

## 2023-07-07 ENCOUNTER — Encounter: Payer: Self-pay | Admitting: Neurology

## 2023-07-08 NOTE — Telephone Encounter (Signed)
The healthcare provider accomodation form has been filled out and signed by Dr Vickey Huger, the form was given to Stanton Kidney in medical records to be faxed.

## 2023-07-09 ENCOUNTER — Telehealth: Payer: Self-pay | Admitting: Anesthesiology

## 2023-07-09 NOTE — Telephone Encounter (Signed)
Form has been updated and given to Charles Hayden in medical records to be faxed.

## 2023-07-20 ENCOUNTER — Encounter: Payer: Self-pay | Admitting: Neurology

## 2023-07-20 ENCOUNTER — Ambulatory Visit: Payer: 59 | Admitting: Neurology

## 2023-07-20 VITALS — BP 118/76 | HR 72 | Ht 67.0 in | Wt 180.0 lb

## 2023-07-20 DIAGNOSIS — G4710 Hypersomnia, unspecified: Secondary | ICD-10-CM | POA: Diagnosis not present

## 2023-07-20 DIAGNOSIS — Z79899 Other long term (current) drug therapy: Secondary | ICD-10-CM

## 2023-07-20 DIAGNOSIS — G47419 Narcolepsy without cataplexy: Secondary | ICD-10-CM | POA: Diagnosis not present

## 2023-07-20 DIAGNOSIS — G4719 Other hypersomnia: Secondary | ICD-10-CM | POA: Diagnosis not present

## 2023-07-20 MED ORDER — XYWAV 500 MG/ML PO SOLN: ORAL | 2 refills | Status: DC

## 2023-07-20 NOTE — Progress Notes (Addendum)
Provider:  Melvyn Novas, MD  Primary Care Physician:  Patient, No Pcp Per No address on file     Referring Provider: No referring provider defined for this encounter.          Chief Complaint according to patient   Patient presents with:    HYPERSOMNIA on XYWAV- Pt is here for follow up on excessive daytime sleepiness. Pt states he is doing well. No new concerns.             HISTORY OF PRESENT ILLNESS:  Vicky Mccanless is a 27 y.o. 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  Chief concern according to patient :  No problems with medication tolerability, no oedema, no change in insurance status . All coverage has been good.  He works still from home     HISTORY OF PRESENT ILLNESS: 03/16/23 : Rv with CD,  Mr Kishi reports he is still fatigued and sleepy , modafinil and high dose caffine use have made it possible to get through the day, barely.  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 He has not been able to obtain [facility] and for this reason is now introduced to [employer] or once a night. He is diagnosed with IDIOPATHIC HYPERSOMNIA and I am not sure if once a day NA oxybate is  available for a non-narcoleptic individual.              09-08-2022: RV with Dr. Vickey Huger.  I have the pleasure of seeing Mr. Scales today, who reports that his excessive daytime sleepiness was only moderately responding to armodafinil.  He was changed to modafinil 200 mg once a day last year in April  22, but he still feels that after a brief" honeymoon " there has been a decrease in effect. What I can offer him today, is a higher dose of Provigil-modafinil. 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   We also have the idiopathic hypersomnia FDA approval for the use of Xyrem.    PS : He has not called [place] for refills and has  meanwhile run out.    The patient did not endorse the fatigue severity scale today of his Epworth sleepiness score was endorsed at 13 /24 points, which is still elevated.   He also will need a temporary doctor's note for today as he wishes to be allowed to continue working from home.   His daytime sleepiness is managed better when he can take power naps- and he feels that this would not be possible in the office environment         ALL:  Jamon Hayhurst is a 27 y.o. male here today for follow up for excessive daytime sleepiness. He continued armodafinil 200mg  daily for about 6 months. He did not return for follow up and refills were not requested. He feels it helped improve EDS about 25%. He feels mood is stable. He is followed regularly by psychiatry. He continues venlafaxine and hydroxyine.      HISTORY (copied from Dr Onofrio Klemp's previous note)   Patient underwent PSG and MSLT in April 2021:IMPRESSION:    1. This multiple sleep latency test reveals a mean sleep latency  of 8.3 minutes with 1 sleep periods during which REM sleep was  recorded.  A  total of 5 sleep periods in 5 nap opportunities were  recorded.    2. This study was preceded by an overnight polysomnogram with a  total sleep time (TST) of 485.5 minutes.      Mclane Arora is a 27 y.o. year old Asian male patient seen here upon referral on 04/26/2020 ,   The patient reports that he is still mostly working from home, and has had some trouble with staying alert at work due to an overwhelming feeling of sleepiness. He underwent weaning off Effexor, before he was tested by a baseline polysomnography and then an MSLT to follow.  He had very mild obstructive sleep apnea but is not a sufficient explanation for his degree of sleepiness normal EKG, his AHI was 5.9.  Sleep efficiency was 88.6% most important is his REM sleep latency he did have 27.3% REM sleep during the night and his REM latency was over 50 minutes.  MSLT followed with the first  nap at 7:37 AM he fell asleep within a 12 minutes latency second nap he fell asleep with a 10-minute latency and then with an 8 minutes into sleep he did have his first REM sleep onset.  The third nap had a latency of only 6 minutes the fourth of 8.5 minutes in the 50s at 5 minutes.  Overall this is clearly a pattern of severe hypersomnia but with a single REM sleep onset we are still not able to identify this clearly as narcolepsy.  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       Review of Systems: Out of a complete 14 system review, the patient complains of only the following symptoms, and all other reviewed systems are negative.:  Fatigue, sleepiness , snoring,    How likely are you to doze in the following situations: 0 = not likely, 1 = slight chance, 2 = moderate chance, 3 = high chance   Sitting and Reading? Watching Television? Sitting inactive in a public place (theater or meeting)? As a passenger in a car for an hour without a break? Lying down in the afternoon when circumstances permit? Sitting and talking to someone? Sitting quietly after lunch without alcohol? In a car, while stopped for a few minutes in traffic?   Total = 10/ 24 points   FSS endorsed at 43/ 63 points.   On XYWAV , see MSLT , but all clinical symptoms for narcolepsy.   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 Now going to [employer] for [employer]. Lives  in [place] with his parents. He has 1/2 brother in [place]. Pt used to work with is [employer] but now is only going to school.    Social Determinants of Health   Financial Resource Strain: Not on file  Food Insecurity: Not on file  Transportation Needs: Not on file  Physical Activity: Not on file  Stress: Not on file  Social Connections: Not on file    Family History  Problem Relation Age of Onset   Alzheimer's disease Maternal Grandfather    Heart disease Neg Hx    Diabetes Neg Hx     Past Medical History:  Diagnosis Date   Allergic rhinitis due to pollen    Anxiety     Past Surgical History:  Procedure Laterality Date   APPENDECTOMY  03/31/2016   Dr. Everlene Farrier   LAPAROSCOPIC APPENDECTOMY N/A 03/31/2016   Procedure: APPENDECTOMY LAPAROSCOPIC;  Surgeon: Leafy Ro, MD;  Location: [facility] ORS;  Service: General;  Laterality: N/A;     Current Outpatient Medications on File Prior to Visit  Medication Sig Dispense Refill   Ca, Mg, K, and Na Oxybates (XYWAV) 500 MG/ML SOLN Titration table for the first 3 weeks is attached separately 300 mL 2   venlafaxine XR (EFFEXOR-XR) 75 MG 24 hr capsule Take 1 capsule (75 mg total) by mouth daily with breakfast. 90 capsule 0   No current facility-administered medications on file prior to visit.    Allergies  Allergen Reactions   Food     Tested via allergist, allergic peanuts...given Epi pen 04/23/11   Pollen Extract     Intradermal testing, 04/23/11     DIAGNOSTIC DATA (LABS, IMAGING, TESTING) - I reviewed patient records, labs, notes, testing and imaging myself where available.  Lab Results  Component Value Date   WBC 4.6 03/01/2018   HGB 14.4 03/01/2018   HCT 42.2 03/01/2018   MCV 86 03/01/2018   PLT 274 03/01/2018      Component Value Date/Time   NA 141 03/16/2023 1118   K 4.3 03/16/2023 1118   CL 100 03/16/2023 1118   CO2 24 03/16/2023 1118   GLUCOSE 90 03/16/2023 1118   GLUCOSE 127 (H) 03/31/2016 0625   BUN 14  03/16/2023 1118   CREATININE 0.91 03/16/2023 1118   CALCIUM 10.2 03/16/2023 1118   PROT 7.9 03/16/2023 1118   ALBUMIN 4.9 03/16/2023 1118   AST 24 03/16/2023 1118   ALT 19 03/16/2023 1118   ALKPHOS 93 03/16/2023 1118   BILITOT 0.3 03/16/2023 1118   GFRNONAA 124 03/01/2018 1100   GFRAA 143 03/01/2018 1100   No results found for: "CHOL", "HDL", "LDLCALC", "LDLDIRECT", "TRIG", "CHOLHDL" Lab Results  Component Value Date   HGBA1C 5.6 03/01/2018   No results found for: "VITAMINB12" Lab Results  Component Value Date   TSH 1.560 03/01/2018    PHYSICAL EXAM:  Today's Vitals   07/20/23 1315  BP: 118/76  Pulse: 72  Weight: 180 lb (81.6 kg)  Height: 5\' 7"  (1.702 m)   Body mass index is 28.19 kg/m.   Wt Readings from Last 3 Encounters:  07/20/23 180 lb (81.6 kg)  03/16/23 175 lb 12.8 oz (79.7 kg)  09/08/22 174 lb 3.2 oz (79 kg)     Ht Readings from Last 3 Encounters:  07/20/23 5\' 7"  (1.702 m)  03/16/23 5\' 8"  (1.727 m)  09/08/22 5\' 6"  (1.676 m)      General: The patient is awake, alert and appears not in acute distress. The patient is well groomed. Head: Normocephalic, atraumatic. Neck is supple. Mallampati 3 plus ,  neck circumference:15 inches . Nasal airflow is patent.  Retrognathia is not seen.  Dental status: biological  Cardiovascular:  Regular rate and cardiac rhythm by pulse,  without distended neck veins. Respiratory: Lungs are clear to auscultation.  Skin:  Without evidence of ankle edema, or rash. Trunk: The patient's posture is erect.   NEUROLOGIC EXAM: The patient is awake and alert, oriented to place and time.   Memory subjective described as intact.  Attention span & concentration ability appears normal.  Speech is fluent,  without  dysarthria, dysphonia or aphasia.  Mood and affect are appropriate.   Cranial nerves:  Pupils are equal and briskly reactive to light.Facial motor strength is symmetric and tongue and uvula move midline.  Neck ROM :  rotation, tilt and flexion extension were normal for age and shoulder shrug was symmetrical.    Motor exam:  Symmetric bulk, tone and ROM.   Normal tone without cog -wheeling, symmetric grip strength .    ASSESSMENT AND PLAN 27 y.o. year old male here with:   EDS,  Positive HLA bio-markers for narcolepsy.     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 On Xywav due to  FDA approved therapy for hypersomnia.    Is interested in single dose  medication, but LUMRYZ is currently only  approved for narcolepsy.  He has currently an  Epworth Sleepiness score of 10/ 24 points on 4.5 gram Xywav twice a night. FSS at 43/ 63 points.  He is in treatment for anxiety.   We discussed a 2/3 and 1/3 dosing approach on Xywav - 6 gram a bedtime and 3 gram in AM.   2)  he has gained weight , from 135 pounds to now 180 pounds, may be having additional sleep disorders now - used to have mild OSA in $-2021 , that could have increased. Marland Kitchen    3) will take labs today and we can order them every 6 th months through [facility], he could  then follow up  virtually with NP.  Refilled medication, CMET, CBC dif.    Follow up Virtually- through our NP within 6 months.    After spending a total time of  18  minutes face to face and additional time for physical and neurologic examination, review of laboratory studies,  personal review of imaging studies, reports and results of other testing and review of referral information / records as far as provided in visit,   Electronically signed by:   Melvyn Novas, MD 07/20/2023 1:25 PM  [facility] Board certified by The [employer] of Sleep Medicine and Diplomate of the [employer] of Sleep Medicine. Board certified In Neurology through the ABPN, Fellow of the [employer] of Neu[employer].

## 2023-07-20 NOTE — Patient Instructions (Signed)
RV in 6 months with Shawnie Dapper, NP. Can be virtual. You will need  basic lab tests every 6 months while on XYWAV-      Here is more info about the once at night dosing , but FDA approved only for  narcolepsy , not idiopathic Hypersomnia.    Sodium Oxybate Extended-Release Suspension What is this medication? SODIUM OXYBATE (SOE dee um OX i bate) treats narcolepsy. It works by decreasing daytime sleepiness, which can help you sleep better at night. It also reduces the number of episodes of sudden muscle weakness (cataplexy). This medicine may be used for other purposes; ask your health care provider or pharmacist if you have questions. COMMON BRAND NAME(S): LUMRYZ What should I tell my care team before I take this medication? They need to know if you have any of these conditions: Depression Diet low in salt Frequently drink alcohol Heart disease High blood pressure History of substance use disorder Kidney disease Liver disease Lung or breathing disease, such as asthma or COPD Mental health conditions Sleep apnea Succinic semialdehyde dehydrogenase deficiency Suicidal thoughts, plans, or attempt An unusual or allergic reaction to oxybate, other medications, foods, dyes, or preservatives Pregnant or trying to get pregnant Breastfeeding How should I use this medication? Take this medication by mouth. Take it once daily at bedtime, while you are in bed. Lie down right away after taking the dose and remain in bed. Take it on an empty stomach, at least 2 hours after food. Keep taking it unless your care team tells you to stop. Mix the contents of the medication packet with water in the provided cup. Shake for at least 60 seconds. Drink the solution within 30 minutes of mixing. Refill the cup with water to mix any medication left in the cup. Mix again for 10 seconds and drink the solution. A special MedGuide will be given to you by the pharmacist with each prescription and refill. Be sure to  read this information carefully each time. This medication comes with INSTRUCTIONS FOR USE. Ask your pharmacist for directions on how to use this medication. Read the information carefully. Talk to your pharmacist or care team if you have questions. Talk to your care team about the use of this medication in children. Special care may be needed. Overdosage: If you think you have taken too much of this medicine contact a poison control center or emergency room at once. NOTE: This medicine is only for you. Do not share this medicine with others. What if I miss a dose? If you miss a dose, skip it. Take your next dose at the normal time. Do not take extra or 2 doses at the same time to make up for the missed dose. What may interact with this medication? Do not take this medication with any of the following: Alcohol Medications that help you fall asleep This medication may also interact with the following: Benzodiazepines, such as alprazolam, diazepam, or lorazepam Certain antihistamines Certain medications for depression, such as amitriptyline, fluoxetine, sertraline, or trazodone Certain medications for seizures, such as divalproex sodium, phenobarbital, primidone, valproic acid Medications that cause drowsiness before a procedure, such as propofol Medications that relax muscles Opioids for pain or cough Phenothiazines, such as chlorpromazine, prochlorperazine, thioridazine This list may not describe all possible interactions. Give your health care provider a list of all the medicines, herbs, non-prescription drugs, or dietary supplements you use. Also tell them if you smoke, drink alcohol, or use illegal drugs. Some items may interact with your  medicine. What should I watch for while using this medication? Visit your care team for regular checks on your progress. Tell your care team if your symptoms do not start to get better or if they get worse. This medication has a risk of abuse and  dependence. Your care team will check you for this while you take this medication. This medication may affect your coordination, reaction time, or judgment. Do not engage in activities that require mental alertness, such as driving or operating machinery, for at least 6 hours after taking this medication. You may fall asleep quickly after taking this medication. Remain in bed and do not attempt to sit or stand up. Drinking alcohol with this medication can increase the risk of these side effects. You may do unusual sleep behaviors or activities you do not remember the day after taking this medication. Activities include driving, making or eating food, talking on the phone, sexual activity, or sleep walking. Stop taking this medication and call your care team right away if you find out you have done activities like this. This medication may cause thoughts of suicide or depression. This includes sudden changes in mood, behaviors, or thoughts. These changes can happen at any time but are more common in the beginning of treatment or after a change in dose. Call your care team right away if you experience these thoughts or worsening depression. What side effects may I notice from receiving this medication? Side effects that you should report to your care team as soon as possible: Allergic reactions--skin rash, itching, hives, swelling of the face, lips, tongue, or throat CNS depression--slow or shallow breathing, shortness of breath, feeling faint, dizziness, confusion, trouble staying awake Mood and behavior changes--anxiety, nervousness, confusion, hallucinations, irritability, hostility, thoughts of suicide or self-harm, worsening mood, feelings of depression Sleep apnea--loud snoring, gasping or choking during sleep, daytime sleepiness Sleepwalking Side effects that usually do not require medical attention (report these to your care team if they continue or are  bothersome): Bedwetting Dizziness Headache Loss of appetite Nausea Vomiting This list may not describe all possible side effects. Call your doctor for medical advice about side effects. You may report side effects to FDA at 1-800-FDA-1088. Where should I keep my medication? Keep out of the reach of children and pets. This medication can be abused. Keep it in a safe place to protect it from theft. Do not share it with anyone. It is only for you. Selling or giving away this medication is dangerous and against the law. Store at room temperature between 20 and 25 degrees C (68 and 77 degrees F). After mixing the powder with water, it should be taken within 30 minutes. Get rid of any unused medication after the expiration date. This medication may cause harm and death if it is taken by other adults, children, or pets. It is important to get rid of the medication as soon as you no longer need it or it is expired. You can do this in two ways: Take the medication to a medication take-back program. Check with your pharmacy or law enforcement to find a location. If you cannot return the medication, flush it down the toilet. NOTE: This sheet is a summary. It may not cover all possible information. If you have questions about this medicine, talk to your doctor, pharmacist, or health care provider.  2024 Elsevier/Gold Standard (2022-05-15 00:00:00) ery 6 months and it would be good if we can do one a year and your future primary Care Physician  can alternate with [place].

## 2023-07-21 LAB — CBC WITH DIFFERENTIAL/PLATELET
Basophils Absolute: 0 10*3/uL (ref 0.0–0.2)
Basos: 1 %
EOS (ABSOLUTE): 0.1 10*3/uL (ref 0.0–0.4)
Eos: 1 %
Hematocrit: 40.9 % (ref 37.5–51.0)
Hemoglobin: 13.4 g/dL (ref 13.0–17.7)
Immature Grans (Abs): 0 10*3/uL (ref 0.0–0.1)
Immature Granulocytes: 0 %
Lymphocytes Absolute: 1.8 10*3/uL (ref 0.7–3.1)
Lymphs: 29 %
MCH: 28.2 pg (ref 26.6–33.0)
MCHC: 32.8 g/dL (ref 31.5–35.7)
MCV: 86 fL (ref 79–97)
Monocytes Absolute: 0.5 10*3/uL (ref 0.1–0.9)
Monocytes: 8 %
Neutrophils Absolute: 3.8 10*3/uL (ref 1.4–7.0)
Neutrophils: 61 %
Platelets: 314 10*3/uL (ref 150–450)
RBC: 4.76 x10E6/uL (ref 4.14–5.80)
RDW: 13.2 % (ref 11.6–15.4)
WBC: 6.1 10*3/uL (ref 3.4–10.8)

## 2023-07-21 LAB — COMPREHENSIVE METABOLIC PANEL
ALT: 69 IU/L — ABNORMAL HIGH (ref 0–44)
AST: 37 IU/L (ref 0–40)
Albumin: 4.6 g/dL (ref 4.3–5.2)
Alkaline Phosphatase: 97 IU/L (ref 44–121)
BUN/Creatinine Ratio: 17 (ref 9–20)
BUN: 16 mg/dL (ref 6–20)
Bilirubin Total: 0.2 mg/dL (ref 0.0–1.2)
CO2: 24 mmol/L (ref 20–29)
Calcium: 9.6 mg/dL (ref 8.7–10.2)
Chloride: 101 mmol/L (ref 96–106)
Creatinine, Ser: 0.92 mg/dL (ref 0.76–1.27)
Globulin, Total: 3 g/dL (ref 1.5–4.5)
Glucose: 88 mg/dL (ref 70–99)
Potassium: 4 mmol/L (ref 3.5–5.2)
Sodium: 140 mmol/L (ref 134–144)
Total Protein: 7.6 g/dL (ref 6.0–8.5)
eGFR: 116 mL/min/{1.73_m2} (ref >59)

## 2023-07-21 NOTE — Progress Notes (Signed)
Normal electrolytes but avery high liver enzyme ( ALT ) . Please make urgent visit with a primary care physician

## 2023-07-22 ENCOUNTER — Telehealth: Payer: Self-pay | Admitting: Anesthesiology

## 2023-07-22 NOTE — Telephone Encounter (Signed)
-----   Message from Boulevard Dohmeier sent at 07/21/2023  5:54 PM EDT ----- Normal electrolytes but avery high liver enzyme ( ALT ) . Please make urgent visit with a primary care physician

## 2023-08-26 ENCOUNTER — Encounter: Payer: Self-pay | Admitting: Anesthesiology

## 2023-08-30 DIAGNOSIS — Z419 Encounter for procedure for purposes other than remedying health state, unspecified: Secondary | ICD-10-CM | POA: Diagnosis not present

## 2023-09-09 ENCOUNTER — Telehealth: Payer: Self-pay | Admitting: Neurology

## 2023-09-09 MED ORDER — XYWAV 500 MG/ML PO SOLN: ORAL | 5 refills | Status: AC

## 2023-09-09 NOTE — Telephone Encounter (Signed)
Refill will be sent to the [facility] pharmacy for the pt.

## 2023-09-09 NOTE — Telephone Encounter (Signed)
Pt is requesting a refill for Ca, Mg, K, and Na Oxybates (XYWAV) 500 MG/ML SOLN  .  Pharmacy: [facility]

## 2023-09-22 ENCOUNTER — Ambulatory Visit: Payer: 59 | Admitting: Family Medicine

## 2023-09-29 ENCOUNTER — Telehealth: Payer: Self-pay | Admitting: Neurology

## 2023-09-29 DIAGNOSIS — Z419 Encounter for procedure for purposes other than remedying health state, unspecified: Secondary | ICD-10-CM | POA: Diagnosis not present

## 2023-09-29 NOTE — Telephone Encounter (Signed)
PA completed on CMM/optum KEY:[id number] Will await determination

## 2023-10-06 NOTE — Telephone Encounter (Signed)
PA approved Request Reference Number: [id number]. XYWAV SOL 0.5GM/ML is approved through 03/29/2024. Your patient may now fill this prescription and it will be covered.. Authorization Expiration Date: March 29, 2024.

## 2023-10-08 NOTE — Telephone Encounter (Signed)
Was advised by Trudee Kuster that another PA was needed through the secondary insurance for the patient. Received a KEY to attempt.  Attempted PA on CMM KEY:[id number] This appears to also be through [employer] I received this message indicating could not complete on CMM.  I have forwarded this response to the PA team with [facility] and hopefully they can help me with this.   "[facility] does not manage prior authorizations for this patient. Please resubmit the ePA request to [facility]"

## 2023-10-20 ENCOUNTER — Other Ambulatory Visit (HOSPITAL_COMMUNITY): Payer: Self-pay

## 2023-10-28 ENCOUNTER — Encounter: Payer: Self-pay | Admitting: Neurology

## 2023-10-29 ENCOUNTER — Telehealth: Payer: Self-pay | Admitting: Neurology

## 2023-10-29 NOTE — Telephone Encounter (Signed)
Completed PA through secondary insurance/ [employer] JWJ:[id number] Will await determination

## 2023-10-30 DIAGNOSIS — Z419 Encounter for procedure for purposes other than remedying health state, unspecified: Secondary | ICD-10-CM | POA: Diagnosis not present

## 2023-11-05 NOTE — Telephone Encounter (Signed)
PA was denied!  The following criteria from the health plan guideline, Antinarcolepsy/Antihyperkinesis Agents, must be met before we can approve this request. Please send [place] supporting chart notes and lab results. - Beneficiary has no current use of alcohol, or sedative hypnotics; AND - Beneficiary does not have succinic semialdehyde dehydrogenase deficiency; AND - Beneficiary has been evaluated for history of drug abuse; AND - Prescriber will monitor for signs of misuse or abuse of sodium oxybate (a.k.a. gammahydroxybutyrate [GHB]) including, but not limited to, the following: -- Use of increasingly large doses, increased frequency of use, drug seeking behavior, feigned cataplexy, etc.; AND - Diagnosis of idiopathic hypersomnia with daytime lapses into sleep or an irrepressible need to sleep on a daily basis for >= 3 months; AND -- Insufficient sleep syndrome is confirmed absent; AND -- MSLT (Multiple Sleep Latency Test) shows one of the following: - Fewer than 2 sleep onset REM periods (SOREMPs, which are REM sleep periods within 15 minutes of sleep onset); OR - No SOREMPs, if the REM latency on the preceding overnight sleep study was less than or equal to 15 minutes; AND -- The presence of >= 1 of the following: - Average sleep latency of less than or equal to 8 minutes on MSLT; - Total 24-hour sleep time is greater than or equal to 660 minutes; AND -- Beneficiary does not have cataplexy; AND -- Hypersomnolence secondary to another sleep disorder, neurologic disorder, medical condition, or by medicine or substance use has been ruled out

## 2023-11-11 ENCOUNTER — Encounter: Payer: Self-pay | Admitting: Neurology

## 2023-11-12 NOTE — Telephone Encounter (Signed)
Called the patient to advise of the email that was sent to the pt yesterday. There was no answer. LVM asking the pt to check email and to sign that form and upload to mychart. Once I have that, then we can send the appeal for the pt

## 2023-11-29 DIAGNOSIS — Z419 Encounter for procedure for purposes other than remedying health state, unspecified: Secondary | ICD-10-CM | POA: Diagnosis not present

## 2023-12-30 DIAGNOSIS — Z419 Encounter for procedure for purposes other than remedying health state, unspecified: Secondary | ICD-10-CM | POA: Diagnosis not present

## 2024-01-21 NOTE — Progress Notes (Deleted)
PATIENT: Charles Hayden DOB: 1996/11/06  REASON FOR VISIT: follow up HISTORY FROM: patient  Virtual Visit via Telephone Note  I connected with [name] on 01/21/24 at  9:00 AM EST by telephone and verified that I am speaking with the correct person using two identifiers.   I discussed the limitations, risks, security and privacy concerns of performing an evaluation and management service by telephone and the availability of in person appointments. I also discussed with the patient that there may be a patient responsible charge related to this service. The patient expressed understanding and agreed to proceed.   History of Present Illness:  01/21/24 ALL: Charles Hayden is a 28 y.o. male here today for follow up for narcolepsy. He was last seen by Dr Charles Hayden 06/2023 and Trudee Kuster continued. Medication was denied 09/2023. Something else started???  07/20/2023 CD:  Chief concern according to patient :  No problems with medication tolerability, no oedema, no change in insurance status. All coverage has been good.  He works still from home    03/16/23 CD: Mr Charles Hayden reports he is still fatigued and sleepy , modafinil and high dose caffine use have made it possible to get through the day, barely.  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 He has not been able to obtain [facility] and for this reason is now introduced to [employer] or once a night. He is diagnosed with IDIOPATHIC HYPERSOMNIA and I am not sure if once a day NA oxybate is  available for a non-narcoleptic individual.    09/08/2022 CD:  I have the pleasure of seeing Mr. Charles Hayden today, who reports that his excessive daytime sleepiness was only moderately responding to armodafinil.  He was changed to modafinil 200 mg once a day last year in April  22, but he still feels that after a brief" honeymoon " there has been a decrease in effect. What I can offer him today, is a higher dose of  Provigil-modafinil. 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   We also have the idiopathic hypersomnia FDA approval for the use of Xyrem.    PS : He has not called [place] for refills and has meanwhile run out.    The patient did not endorse the fatigue severity scale today of his Epworth sleepiness score was endorsed at 13 /24 points, which is still elevated.   He also will need a temporary doctor's note for today as he wishes to be allowed to continue working from home.   His daytime sleepiness is managed better when he can take power naps- and he feels that this would not be possible in the office environment  04/18/21 ALL:  Charles Hayden is a 28 y.o. male here today for follow up for excessive daytime sleepiness. He continued armodafinil 200mg  daily for about 6 months. He did not return for follow up and refills were not requested. He feels it helped improve EDS about 25%. He feels mood is stable. He is followed regularly by psychiatry. He continues venlafaxine and hydroxyine.   HISTORY (copied from Dr Dohmeier's previous note)  Charles Hayden is a 28 y.o. year old Asian male patient seen here upon referral on 04/26/2020 ,   The patient reports that he is still mostly working from home, and has had some trouble with staying alert at work due to an  overwhelming feeling of sleepiness. He underwent weaning off Effexor, before he was tested by a baseline polysomnography and then an MSLT to follow.  He had very mild obstructive sleep apnea but is not a sufficient explanation for his degree of sleepiness normal EKG, his AHI was 5.9.  Sleep efficiency was 88.6% most important is his REM sleep latency he did have 27.3% REM sleep during the night and his REM latency was over 50 minutes.  MSLT followed with the first nap at 7:37 AM he fell asleep within a 12 minutes latency second nap he fell asleep with a 10-minute latency and then with  an 8 minutes into sleep he did have his first REM sleep onset.  The third nap had a latency of only 6 minutes the fourth of 8.5 minutes in the 50s at 5 minutes.  Overall this is clearly a pattern of severe hypersomnia but with a single REM sleep onset we are still not able to identify this clearly as narcolepsy.  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   Observations/Objective:  Generalized: Well developed, in no acute distress  Mentation: Alert oriented to time, place, history taking. Follows all commands speech and language fluent   Assessment and Plan:  28 y.o. year old male  has a past medical history of Allergic rhinitis due to pollen and Anxiety. here with    ICD-10-CM   1. 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 Excessive daytime sleepiness  G47.19       No orders of the defined types were placed in this encounter.   No orders of the defined types were placed in this encounter.    Follow Up Instructions:  I discussed the assessment and treatment plan with the patient. The patient was provided an opportunity to ask questions and all were answered. The patient agreed with the plan and demonstrated an understanding of the instructions.   The patient was advised to call back or seek an in-person evaluation if the symptoms worsen or if the condition fails to improve as anticipated.  I provided *** minutes of non-face-to-face time during this encounter. Patient located at their place of residence during Mychart visit. Provider is in the office.    Shawnie Dapper, NP

## 2024-01-21 NOTE — Patient Instructions (Incomplete)

## 2024-01-25 ENCOUNTER — Telehealth: Payer: Self-pay | Admitting: Family Medicine

## 2024-01-25 NOTE — Telephone Encounter (Signed)
LVM and sent mychart msg informing pt of need to reschedule 01/26/24 appt - NP out

## 2024-01-26 ENCOUNTER — Telehealth: Payer: 59 | Admitting: Family Medicine

## 2024-01-26 DIAGNOSIS — G4711 Idiopathic hypersomnia with long sleep time: Secondary | ICD-10-CM

## 2024-01-26 DIAGNOSIS — G4710 Hypersomnia, unspecified: Secondary | ICD-10-CM

## 2024-01-26 DIAGNOSIS — G4719 Other hypersomnia: Secondary | ICD-10-CM

## 2024-01-27 NOTE — Telephone Encounter (Signed)
LVM offering to r/s VV, per Amy we can add VV between 8-11 on 1/31 if they call back.

## 2024-01-30 DIAGNOSIS — Z419 Encounter for procedure for purposes other than remedying health state, unspecified: Secondary | ICD-10-CM | POA: Diagnosis not present

## 2024-02-27 DIAGNOSIS — Z419 Encounter for procedure for purposes other than remedying health state, unspecified: Secondary | ICD-10-CM | POA: Diagnosis not present

## 2024-04-09 DIAGNOSIS — Z419 Encounter for procedure for purposes other than remedying health state, unspecified: Secondary | ICD-10-CM | POA: Diagnosis not present

## 2024-05-09 DIAGNOSIS — Z419 Encounter for procedure for purposes other than remedying health state, unspecified: Secondary | ICD-10-CM | POA: Diagnosis not present

## 2024-06-09 DIAGNOSIS — Z419 Encounter for procedure for purposes other than remedying health state, unspecified: Secondary | ICD-10-CM | POA: Diagnosis not present

## 2024-07-09 DIAGNOSIS — Z419 Encounter for procedure for purposes other than remedying health state, unspecified: Secondary | ICD-10-CM | POA: Diagnosis not present

## 2024-08-09 DIAGNOSIS — Z419 Encounter for procedure for purposes other than remedying health state, unspecified: Secondary | ICD-10-CM | POA: Diagnosis not present

## 2024-09-09 DIAGNOSIS — Z419 Encounter for procedure for purposes other than remedying health state, unspecified: Secondary | ICD-10-CM | POA: Diagnosis not present

## 2024-12-09 DIAGNOSIS — Z419 Encounter for procedure for purposes other than remedying health state, unspecified: Secondary | ICD-10-CM | POA: Diagnosis not present
